# Patient Record
Sex: Female | Born: 1985 | Race: Black or African American | Hispanic: No | Smoking: Former smoker
Health system: Southern US, Community
[De-identification: ages and names within clinical notes are randomized; demographics above are authoritative.]

## PROBLEM LIST (undated history)

## (undated) ENCOUNTER — Inpatient Hospital Stay (HOSPITAL_COMMUNITY): Payer: Self-pay

## (undated) DIAGNOSIS — N159 Renal tubulo-interstitial disease, unspecified: Secondary | ICD-10-CM

## (undated) DIAGNOSIS — B20 Human immunodeficiency virus [HIV] disease: Secondary | ICD-10-CM

## (undated) DIAGNOSIS — Z8759 Personal history of other complications of pregnancy, childbirth and the puerperium: Secondary | ICD-10-CM

## (undated) DIAGNOSIS — D649 Anemia, unspecified: Secondary | ICD-10-CM

---

## 2009-06-11 ENCOUNTER — Emergency Department (HOSPITAL_COMMUNITY): Admission: EM | Admit: 2009-06-11 | Discharge: 2009-06-11 | Payer: Self-pay | Admitting: Emergency Medicine

## 2009-06-16 ENCOUNTER — Emergency Department (HOSPITAL_COMMUNITY): Admission: EM | Admit: 2009-06-16 | Discharge: 2009-06-16 | Payer: Self-pay | Admitting: Emergency Medicine

## 2010-02-05 ENCOUNTER — Emergency Department (HOSPITAL_COMMUNITY): Admission: EM | Admit: 2010-02-05 | Discharge: 2010-02-06 | Payer: Self-pay | Admitting: Emergency Medicine

## 2010-02-27 ENCOUNTER — Inpatient Hospital Stay (HOSPITAL_COMMUNITY): Admission: AD | Admit: 2010-02-27 | Discharge: 2010-02-28 | Payer: Self-pay | Admitting: Obstetrics & Gynecology

## 2010-03-29 ENCOUNTER — Encounter: Payer: Self-pay | Admitting: Internal Medicine

## 2010-03-29 DIAGNOSIS — B20 Human immunodeficiency virus [HIV] disease: Secondary | ICD-10-CM

## 2010-03-29 LAB — CONVERTED CEMR LAB
Hemoglobin: 10.5 g/dL
Platelets: 266 10*3/uL

## 2010-03-30 ENCOUNTER — Ambulatory Visit: Payer: Self-pay | Admitting: Internal Medicine

## 2010-03-30 DIAGNOSIS — O09299 Supervision of pregnancy with other poor reproductive or obstetric history, unspecified trimester: Secondary | ICD-10-CM | POA: Insufficient documentation

## 2010-03-30 LAB — CONVERTED CEMR LAB
ALT: 12 units/L (ref 0–35)
AST: 21 units/L (ref 0–37)
Albumin: 3.5 g/dL (ref 3.5–5.2)
BUN: 6 mg/dL (ref 6–23)
Cholesterol: 228 mg/dL — ABNORMAL HIGH (ref 0–200)
Creatinine, Ser: 0.47 mg/dL (ref 0.40–1.20)
Eosinophils Relative: 0 % (ref 0–5)
Glucose, Bld: 99 mg/dL (ref 70–99)
HIV-1 RNA Quant, Log: 3.85 — ABNORMAL HIGH (ref <1.68)
HIV-1 antibody: POSITIVE — AB
HIV: REACTIVE
Hemoglobin: 10.4 g/dL — ABNORMAL LOW (ref 12.0–15.0)
LDL Cholesterol: 140 mg/dL — ABNORMAL HIGH (ref 0–99)
Lymphocytes Relative: 25 % (ref 12–46)
Lymphs Abs: 1.4 10*3/uL (ref 0.7–4.0)
MCHC: 31.8 g/dL (ref 30.0–36.0)
MCV: 72.2 fL — ABNORMAL LOW (ref 78.0–100.0)
Monocytes Absolute: 0.5 10*3/uL (ref 0.1–1.0)
Neutrophils Relative %: 67 % (ref 43–77)
Pap Smear: NORMAL
Platelets: 306 10*3/uL (ref 150–400)
RBC: 4.53 M/uL (ref 3.87–5.11)
RDW: 15.3 % (ref 11.5–15.5)
Total Protein: 7.2 g/dL (ref 6.0–8.3)
Triglycerides: 177 mg/dL — ABNORMAL HIGH (ref <150)

## 2010-04-02 LAB — CONVERTED CEMR LAB
Bilirubin Urine: NEGATIVE
Crystals: NONE SEEN
GC Probe Amp, Urine: NEGATIVE
Hemoglobin, Urine: NEGATIVE
Protein, ur: NEGATIVE mg/dL
Specific Gravity, Urine: 1.022 (ref 1.005–1.030)

## 2010-04-05 ENCOUNTER — Encounter: Payer: Self-pay | Admitting: Obstetrics & Gynecology

## 2010-04-05 ENCOUNTER — Ambulatory Visit: Payer: Self-pay | Admitting: Family Medicine

## 2010-04-11 ENCOUNTER — Ambulatory Visit: Payer: Self-pay | Admitting: Internal Medicine

## 2010-04-12 DIAGNOSIS — Z8619 Personal history of other infectious and parasitic diseases: Secondary | ICD-10-CM

## 2010-04-19 ENCOUNTER — Ambulatory Visit: Payer: Self-pay | Admitting: Obstetrics & Gynecology

## 2010-04-19 LAB — CONVERTED CEMR LAB
Chlamydia, DNA Probe: NEGATIVE
GC Probe Amp, Genital: NEGATIVE

## 2010-04-20 ENCOUNTER — Encounter: Payer: Self-pay | Admitting: Obstetrics & Gynecology

## 2010-04-20 LAB — CONVERTED CEMR LAB
Clue Cells Wet Prep HPF POC: NONE SEEN
Yeast Wet Prep HPF POC: NONE SEEN

## 2010-05-03 ENCOUNTER — Ambulatory Visit: Payer: Self-pay | Admitting: Family Medicine

## 2010-05-03 ENCOUNTER — Encounter (INDEPENDENT_AMBULATORY_CARE_PROVIDER_SITE_OTHER): Payer: Self-pay | Admitting: *Deleted

## 2010-05-08 ENCOUNTER — Ambulatory Visit (HOSPITAL_COMMUNITY): Admission: RE | Admit: 2010-05-08 | Discharge: 2010-05-08 | Payer: Self-pay | Admitting: Family Medicine

## 2010-05-10 ENCOUNTER — Encounter: Payer: Self-pay | Admitting: Internal Medicine

## 2010-05-10 ENCOUNTER — Ambulatory Visit: Payer: Self-pay | Admitting: Family Medicine

## 2010-05-10 ENCOUNTER — Encounter (INDEPENDENT_AMBULATORY_CARE_PROVIDER_SITE_OTHER): Payer: Self-pay | Admitting: *Deleted

## 2010-05-11 ENCOUNTER — Encounter: Payer: Self-pay | Admitting: Family Medicine

## 2010-05-11 ENCOUNTER — Ambulatory Visit: Payer: Self-pay | Admitting: Internal Medicine

## 2010-05-11 ENCOUNTER — Inpatient Hospital Stay (HOSPITAL_COMMUNITY): Admission: AD | Admit: 2010-05-11 | Discharge: 2010-05-13 | Payer: Self-pay | Admitting: Obstetrics and Gynecology

## 2010-05-11 LAB — CONVERTED CEMR LAB
ALT: 10 units/L (ref 0–35)
Albumin: 3.4 g/dL — ABNORMAL LOW (ref 3.5–5.2)
BUN: 5 mg/dL — ABNORMAL LOW (ref 6–23)
Basophils Relative: 0 % (ref 0–1)
CO2: 20 meq/L (ref 19–32)
Chloride: 102 meq/L (ref 96–112)
Creatinine, Ser: 0.52 mg/dL (ref 0.40–1.20)
Eosinophils Absolute: 0.1 10*3/uL (ref 0.0–0.7)
Monocytes Relative: 9 % (ref 3–12)
Neutrophils Relative %: 61 % (ref 43–77)
Potassium: 3.5 meq/L (ref 3.5–5.3)
RDW: 17.9 % — ABNORMAL HIGH (ref 11.5–15.5)
Sodium: 134 meq/L — ABNORMAL LOW (ref 135–145)
Total Bilirubin: 0.9 mg/dL (ref 0.3–1.2)

## 2010-05-17 ENCOUNTER — Ambulatory Visit: Payer: Self-pay | Admitting: Obstetrics & Gynecology

## 2010-05-21 ENCOUNTER — Ambulatory Visit: Payer: Self-pay | Admitting: Obstetrics & Gynecology

## 2010-05-24 ENCOUNTER — Ambulatory Visit: Payer: Self-pay | Admitting: Obstetrics and Gynecology

## 2010-05-25 ENCOUNTER — Ambulatory Visit: Payer: Self-pay | Admitting: Internal Medicine

## 2010-05-28 ENCOUNTER — Ambulatory Visit: Payer: Self-pay | Admitting: Obstetrics & Gynecology

## 2010-05-31 ENCOUNTER — Encounter: Payer: Self-pay | Admitting: Obstetrics & Gynecology

## 2010-05-31 ENCOUNTER — Ambulatory Visit: Payer: Self-pay | Admitting: Obstetrics & Gynecology

## 2010-05-31 LAB — CONVERTED CEMR LAB: GC Probe Amp, Genital: NEGATIVE

## 2010-06-01 ENCOUNTER — Encounter: Payer: Self-pay | Admitting: Obstetrics & Gynecology

## 2010-06-01 LAB — CONVERTED CEMR LAB: Trich, Wet Prep: NONE SEEN

## 2010-06-04 ENCOUNTER — Ambulatory Visit: Payer: Self-pay | Admitting: Obstetrics & Gynecology

## 2010-06-07 ENCOUNTER — Ambulatory Visit: Payer: Self-pay | Admitting: Obstetrics & Gynecology

## 2010-06-11 ENCOUNTER — Ambulatory Visit: Payer: Self-pay | Admitting: Obstetrics & Gynecology

## 2010-06-13 ENCOUNTER — Inpatient Hospital Stay (HOSPITAL_COMMUNITY): Admission: RE | Admit: 2010-06-13 | Discharge: 2010-06-16 | Payer: Self-pay | Admitting: Obstetrics & Gynecology

## 2010-06-13 ENCOUNTER — Ambulatory Visit: Payer: Self-pay | Admitting: Family Medicine

## 2010-07-30 ENCOUNTER — Ambulatory Visit: Payer: Self-pay | Admitting: Family Medicine

## 2010-08-02 ENCOUNTER — Ambulatory Visit: Payer: Self-pay | Admitting: Internal Medicine

## 2010-09-27 ENCOUNTER — Encounter (INDEPENDENT_AMBULATORY_CARE_PROVIDER_SITE_OTHER): Payer: Self-pay | Admitting: *Deleted

## 2010-10-01 ENCOUNTER — Ambulatory Visit: Payer: Self-pay | Admitting: Internal Medicine

## 2010-11-13 ENCOUNTER — Encounter (INDEPENDENT_AMBULATORY_CARE_PROVIDER_SITE_OTHER): Payer: Self-pay | Admitting: *Deleted

## 2010-11-20 NOTE — Miscellaneous (Signed)
Summary: HIPAA Restrictions  HIPAA Restrictions   Imported By: Florinda Marker 03/30/2010 14:03:39  _____________________________________________________________________  External Attachment:    Type:   Image     Comment:   External Document

## 2010-11-20 NOTE — Consult Note (Signed)
Summary: Prenatal Flowsheet  Prenatal Flowsheet   Imported By: Florinda Marker 04/18/2010 15:19:49  _____________________________________________________________________  External Attachment:    Type:   Image     Comment:   External Document

## 2010-11-20 NOTE — Assessment & Plan Note (Signed)
Summary: F/U /OV/VS   CC:  f/u.  History of Present Illness: Pt here for f/u.  She is about [redacted] weeks pregnant. She states that she has been feeling well. She states that she has been taking her HIV meds every day.   Updated Prior Medication List: PRENAVITE MULTIPLE VITAMIN 28-0.8 MG TABS (PRENATAL VIT-FE FUMARATE-FA) Take 1 tablet by mouth once a day COMBIVIR 150-300 MG TABS (LAMIVUDINE-ZIDOVUDINE) Take 1 tablet by mouth two times a day KALETRA 200-50 MG TABS (LOPINAVIR-RITONAVIR) take 3 tablets by mouth two times a day  Current Allergies (reviewed today): ! BACTRIM DS (SULFAMETHOXAZOLE-TRIMETHOPRIM) Review of Systems  The patient denies fever, chest pain, and syncope.    Vital Signs:  Patient profile:   25 year old female Height:      64 inches (162.56 cm) Weight:      177 pounds (80.45 kg) BMI:     30.49 Temp:     98.1 degrees F (36.72 degrees C) oral BP sitting:   107 / 72  (left arm)  Vitals Entered By: Wendall Mola CMA Duncan Dull) (May 25, 2010 10:17 AM) CC: f/u Is Patient Diabetic? No Pain Assessment Patient in pain? no      Nutritional Status BMI of > 30 = obese Nutritional Status Detail nl  Does patient need assistance? Functional Status Self care Ambulation Normal   Physical Exam  General:  alert, well-developed, well-nourished, and well-hydrated.   Head:  normocephalic and atraumatic.   Lungs:  normal breath sounds.     Impression & Recommendations:  Problem # 1:  HIV INFECTION (ICD-042) Pt is about [redacted] weeks pregnant and the determination needs to be made regarding type of delivery.  Her las VL was 816.  I am going to run a stat VL today and should have it back early next week.  I will send the results to [facility].  Hopefully her VL will have come down so she can have a vaginal delivery. Once the baby is delivered she should reduce her Kaletra to 2 tablets two times a day and call for an appt. Orders: Est. Patient Level IV (30865) T-HIV  Viral Load [phone])  Diagnostics Reviewed:  HIV: HIV positive - not AIDS (05/10/2010)   HIV-Western blot: Positive (03/30/2010)   CD4: 620 (05/11/2010)   WBC: 5.6 (05/11/2010)   Hgb: 10.3 (05/11/2010)   HCT: 31.0 (05/11/2010)   Platelets: 242 (05/11/2010) HIV genotype: See Comment (03/30/2010)   HIV-1 RNA: 816 (05/11/2010)   HBSAg: NEG (03/30/2010)  Patient Instructions: 1)  will call patient with results of viral load 2)  after baby is delivered can decrease Kaletra to 2 tablets twice a day and then call to schedule an appt.

## 2010-11-20 NOTE — Miscellaneous (Signed)
Summary: RW Flowsheet update  Clinical Lists Changes  Observations: Added new observation of PAYOR: Medicaid (05/10/2010 16:41) Added new observation of REC_MESSAGE: Yes (05/10/2010 16:41) Added new observation of RECPHONECALL: Yes (05/10/2010 16:41) Added new observation of REC_MAIL: Yes (05/10/2010 16:41) Added new observation of MARITAL STAT: SEPARTATED (05/10/2010 16:41) Added new observation of LATINO/HISP: No (05/10/2010 16:41) Added new observation of INFECTDIS MD: Philipp Deputy (05/10/2010 16:41) Added new observation of DATE1STVISIT: 04/11/2010 (05/10/2010 16:41) Added new observation of RACE: African American (05/10/2010 16:41) Added new observation of PREVENTPOS: 04/11/2010 (05/10/2010 16:41) Added new observation of MEDADHERENCE: 04/11/2010 (05/10/2010 16:41) Added new observation of HIV STATUS: HIV positive - not AIDS (05/10/2010 16:41) Added new observation of HIVCURRMEDST: 04/11/2010 (05/10/2010 16:41) Added new observation of HIVINITMEDST: 04/11/2010 (05/10/2010 16:41) Added new observation of GENDER: Female (05/10/2010 16:41) Added new observation of HIV RISK BEH: Heterosexual contact (05/10/2010 16:41) Added new observation of PATNTCOUNTY: [place] (05/10/2010 16:41) Added new observation of RW VITAL STA: Active (05/10/2010 16:41) Added new observation of RWPARTICIP: Yes (05/10/2010 16:41) Added new observation of HIVMEDPREG: Yes (05/10/2010 16:41)           Medication Adherence: 04/11/2010   Adherence to medications reviewed with patient. Counseling to provide adequate adherence provided    Prevention For Positives: 04/11/2010   Safe sex practices discussed with patient. Condoms offered.

## 2010-11-20 NOTE — Miscellaneous (Signed)
Summary: Orders Update  Clinical Lists Changes  Problems: Added new problem of SUPERVISION OF OTHER HIGH-RISK PREGNANCY (ICD-V23.89) Added new problem of HIV INFECTION (ICD-042) Orders: Added new Test order of T-Comprehensive Metabolic Panel [phone]) - Signed Added new Test order of T-CBC w/Diff [phone]) - Signed Added new Test order of T-CD4SP [facility]) (CD4SP) - Signed Added new Test order of T-Chlamydia  Probe, urine [phone]) - Signed Added new Test order of T-GC Probe, urine [phone]) - Signed Added new Test order of T-Hepatitis B Surface Antigen [phone]) - Signed Added new Test order of T-Hepatitis B Surface Antibody [phone]) - Signed Added new Test order of T-Hepatitis B Core Antibody ([id number]) - Signed Added new Test order of T-Hepatitis C Antibody ([id number]) - Signed Added new Test order of T-HIV Antibody  (Reflex) [phone]) - Signed Added new Test order of T-HIV Ab Confirmatory Test/Western Blot ([id number]) - Signed Added new Test order of T-Lipid Profile ([id number]) - Signed Added new Test order of T-RPR (Syphilis) ([id number]) - Signed Added new Test order of T-Urinalysis ([id number]) - Signed Added new Test order of T-HIV1 Quant rflx Ultra or Genotype ([id number]) - Signed Added new Test order of T-Hepatitis A Antibody ([id number]) - Signed

## 2010-11-20 NOTE — Assessment & Plan Note (Signed)
Summary: new 042 prego 26   CC:  new patient to establish and lab results.  History of Present Illness: This is the first ID clinic visit for Catherine Martinez. She is [redacted] weeks pregnant and recently found out she was HIV (+).  She tested negative about 3 years ago.  her pregnancy has been going well.  Pending results of lab work on HIV meds the decsision will be made to either induce her at 38 weeks and perform a c-section or have her deliver vaginally. Diagnosed with chlamydia and treated by her OB. Pt has a history of stillbirth in 2003 and has 2 live children. Pt's risk factor is heterosexual intercourse.  Preventive Screening-Counseling & Management  Alcohol-Tobacco     Alcohol drinks/day: 0     Year Quit: 3-11     Pack years: 1pack every 2 weeks   Caffeine-Diet-Exercise     Caffeine use/day: 0     Does Patient Exercise: yes     Type of exercise: swimming and walking     Exercise (avg: min/session): 30-60     Times/week: 5  Safety-Violence-Falls     Seat Belt Use: yes      Sexual History:  dating father of baby.        Drug Use:  never.    Comments: pt. declined condoms   Updated Prior Medication List: PRENAVITE MULTIPLE VITAMIN 28-0.8 MG TABS (PRENATAL VIT-FE FUMARATE-FA) Take 1 tablet by mouth once a day COMBIVIR 150-300 MG TABS (LAMIVUDINE-ZIDOVUDINE) Take 1 tablet by mouth two times a day KALETRA 200-50 MG TABS (LOPINAVIR-RITONAVIR) take 3 tablets by mouth two times a day  Current Allergies: ! BACTRIM DS (SULFAMETHOXAZOLE-TRIMETHOPRIM) Social History: Sexual History:  dating father of baby Drug Use:  never  Review of Systems  The patient denies anorexia, fever, and abdominal pain.    Vital Signs:  Patient profile:   25 year old female Height:      64 inches (162.56 cm) Weight:      173.4 pounds (78.82 kg) BMI:     29.87 Temp:     98.5 degrees F (36.94 degrees C) oral Pulse rate:   97 / minute BP sitting:   105 / 73  (left arm)  Vitals Entered By: Wendall Mola CMA Duncan Dull) (April 11, 2010 10:48 AM) CC: new patient to establish and lab results Is Patient Diabetic? No Pain Assessment Patient in pain? no      Nutritional Status BMI of 25 - 29 = overweight Nutritional Status Detail appetite "better"  Have you ever been in a relationship where you felt threatened, hurt or afraid?No   Does patient need assistance? Functional Status Self care Ambulation Normal Comments pt. is [redacted] weeks pregnant   Physical Exam  General:  alert, well-developed, well-nourished, and well-hydrated.   Head:  normocephalic and atraumatic.   Mouth:  pharynx pink and moist.  no thrush  Lungs:  normal breath sounds.   Abdomen:  prgnant abdomen   Impression & Recommendations:  Problem # 1:  HIV INFECTION (ICD-042) I discussed the pathophysiology of HIV and the meaning of CD4ct and VL. Discussed need to be on antiretroviral therapy to prevent infection of her baby.  She has a low VL so we may need to get her VL <48 before she is due to deliver so they can deliver her vaginally. We will start her on combivir and Kaletra and re-check her VL in 4 weeks. Potential side effects are discussed.  If we are unable to  get her VL down she will need an elective c-section.  Orders: New Patient Level III (99203)Future Orders: T-CBC w/Diff ([id number]) ... 05/14/2010 T-CD4SP ([facility]) (CD4SP) ... 05/14/2010 T-Comprehensive Metabolic Panel [phone]) ... 05/14/2010 T-HIV Viral Load [phone]) ... 05/14/2010  Medications Added to Medication List This Visit: 1)  Prenavite Multiple Vitamin 28-0.8 Mg Tabs (Prenatal vit-fe fumarate-fa) .... Take 1 tablet by mouth once a day 2)  Combivir 150-300 Mg Tabs (Lamivudine-zidovudine) .... Take 1 tablet by mouth two times a day 3)  Kaletra 200-50 Mg Tabs (Lopinavir-ritonavir) .... Take 3 tablets by mouth two times a day  Patient Instructions: 1)  Please schedule a follow-up appointment in 6 weeks. Prescriptions: KALETRA  200-50 MG TABS (LOPINAVIR-RITONAVIR) take 3 tablets by mouth two times a day  #180 x 5   Entered and Authorized by:   Yisroel Ramming MD   Signed by:   Yisroel Ramming MD on 04/11/2010   Method used:   Print then Give to Patient   RxID:   [id number] COMBIVIR 150-300 MG TABS (LAMIVUDINE-ZIDOVUDINE) Take 1 tablet by mouth two times a day  #60 x 5   Entered and Authorized by:   Yisroel Ramming MD   Signed by:   Yisroel Ramming MD on 04/11/2010   Method used:   Print then Give to Patient   RxID:   [id number]

## 2010-11-20 NOTE — Assessment & Plan Note (Signed)
Summary: F/U OV/VS   CC:  follow-up visit, lab results, and and discuss meds.  History of Present Illness: Catherine Martinez delivered her son without incidence. He has tested negative and is about 2 months old.  She is doing well and would like to change her medication to a easier regimen.  Preventive Screening-Counseling & Management  Alcohol-Tobacco     Alcohol drinks/day: 0     Year Quit: 3-11     Pack years: 1pack every 2 weeks   Caffeine-Diet-Exercise     Caffeine use/day: 0     Does Patient Exercise: yes     Type of exercise: swimming and walking     Exercise (avg: min/session): 30-60     Times/week: 5  Safety-Violence-Falls     Seat Belt Use: yes      Sexual History:  dating father of baby.        Drug Use:  never.    Comments: Catherine Martinez. declined condoms   Updated Prior Medication List: PRENAVITE MULTIPLE VITAMIN 28-0.8 MG TABS (PRENATAL VIT-FE FUMARATE-FA) Take 1 tablet by mouth once a day ATRIPLA 600-200-300 MG TABS (EFAVIRENZ-EMTRICITAB-TENOFOVIR) Take 1 tablet by mouth at bedtime  Current Allergies (reviewed today): ! BACTRIM DS (SULFAMETHOXAZOLE-TRIMETHOPRIM)  Review of Systems  The patient denies anorexia, fever, and weight loss.    Vital Signs:  Patient profile:   25 year old female Height:      64 inches (162.56 cm) Weight:      157.4 pounds (71.55 kg) BMI:     27.12 Temp:     98.7 degrees F (37.06 degrees C) oral Pulse rate:   80 / minute BP sitting:   124 / 84  (right arm)  Vitals Entered By: Wendall Mola CMA Duncan Dull) (August 02, 2010 9:27 AM) CC: follow-up visit, lab results, and discuss meds Is Patient Diabetic? No Pain Assessment Patient in pain? no      Nutritional Status BMI of 25 - 29 = overweight Nutritional Status Detail appetite "good"  Does patient need assistance? Functional Status Self care Ambulation Normal Comments Catherine Martinez. missed one dose of meds since last visit   Physical Exam  General:  alert, well-developed, well-nourished,  and well-hydrated.   Head:  normocephalic and atraumatic.   Mouth:  pharynx pink and moist.   Lungs:  normal breath sounds.     Impression & Recommendations:  Problem # 1:  HIV INFECTION (ICD-042)  Will change regimen to Atripla. Discused potential side effects. Catherine Martinez will f/u in 6 weeks for repeat labs.  Influenza a pneumavax given. Diagnostics Reviewed:  HIV: HIV positive - not AIDS (05/10/2010)   HIV-Western blot: Positive (03/30/2010)   CD4: 620 (05/11/2010)   WBC: 5.6 (05/11/2010)   Hgb: 10.3 (05/11/2010)   HCT: 31.0 (05/11/2010)   Platelets: 242 (05/11/2010) HIV genotype: See Comment (03/30/2010)   HIV-1 RNA: 631 (05/25/2010)   HBSAg: NEG (03/30/2010)  Orders: Est. Patient Level III (99213)Future Orders: T-CD4SP ([facility]) (CD4SP) ... 09/13/2010 T-HIV Viral Load [phone]) ... 09/13/2010 T-Comprehensive Metabolic Panel [phone]) ... 09/13/2010 T-CBC w/Diff ([id number]) ... 09/13/2010  Medications Added to Medication List This Visit: 1)  Atripla 600-200-300 Mg Tabs (Efavirenz-emtricitab-tenofovir) .... Take 1 tablet by mouth at bedtime  Other Orders: Influenza Vaccine NON MCR (57846) TB Skin Test (96295) Admin 1st Vaccine (28413) Pneumococcal Vaccine (24401) Admin of Any Addtl Vaccine (02725)  Patient Instructions: 1)  Please schedule a follow-up appointment in 8 weeks, 2 weeks after labs. Prescriptions: ATRIPLA 600-200-300 MG TABS (EFAVIRENZ-EMTRICITAB-TENOFOVIR) Take 1 tablet by  mouth at bedtime  #30 x 5   Entered by:   Wendall Mola CMA ( AAMA)   Authorized by:   Yisroel Ramming MD   Signed by:   Wendall Mola CMA ( AAMA) on 08/02/2010   Method used:   Electronically to        [facility] (retail)       [address]       [place], [place]  [address]       Ph: [phone]       Fax: [phone]   RxID:   [id number] ATRIPLA 600-200-300 MG TABS (EFAVIRENZ-EMTRICITAB-TENOFOVIR) Take 1 tablet by mouth at bedtime   #30 x 5   Entered and Authorized by:   Yisroel Ramming MD   Signed by:   Yisroel Ramming MD on 08/02/2010   Method used:   Print then Give to Patient   RxID:   [id number]   Per Catherine Martinez. RX sent electronically to [facility] on [address] CMA Duncan Dull)  August 02, 2010 9:47 AM    Immunizations Administered:  Influenza Vaccine # 1:    Vaccine Type: Fluvax Non-MCR    Site: right deltoid    Mfr: Novartis    Dose: 0.5 ml    Route: IM    Given by: Wendall Mola CMA ( AAMA)    Exp. Date: 01/20/2011    Lot #: [id number]    VIS given: 05/15/10 version given August 02, 2010.  PPD Skin Test:    Vaccine Type: PPD    Site: left forearm    Mfr: Sanofi Pasteur    Dose: 0.1 ml    Route: ID    Given by: Wendall Mola CMA ( AAMA)    Exp. Date: 08/23/2011    Lot #: [id number]  Pneumonia Vaccine:    Vaccine Type: Pneumovax    Site: left deltoid    Mfr: Merck    Dose: 0.5 ml    Route: IM    Given by: Wendall Mola CMA ( AAMA)    Exp. Date: 01/05/2012    Lot #: [id number]    VIS given: 09/25/09 version given August 02, 2010.  Flu Vaccine Consent Questions:    Do you have a history of severe allergic reactions to this vaccine? no    Any prior history of allergic reactions to egg and/or gelatin? no    Do you have a sensitivity to the preservative Thimersol? no    Do you have a past history of Guillan-Barre Syndrome? no    Do you currently have an acute febrile illness? no    Have you ever had a severe reaction to latex? no    Vaccine information given and explained to patient? yes    Are you currently pregnant? no

## 2010-11-20 NOTE — Assessment & Plan Note (Signed)
Summary: New 042  26 wks preg/update info.      Infectious Disease New Patient Intake [facility]   Return Appointment With Physician: Dr Philipp Deputy    Physician Name: Dr Ambrose Mantle   Medical History  Family History Hypertension: Yes  Family Side: Maternal  Comments: Grandfather Diabetes: Yes  Family Side: Maternal Thyroid Disease: Yes  Family Side: Maternal  Comments: Aunt  Tobacco Use: previous Year quit smoking: 3-11 Smoking pack-years: 1pack every 2 weeks   HIV Intake Information When did you first test positive for HIV? 03/29/2010  Obstetric/Gynecological History Pap Smear Date: 2008 Result:  normal Office/Clinic:  Dr Ambrose Mantle    Last Menstrual Period: 01-03-10  Pregnancy History Gravida: 4 Para: 3    -  Date:  03/29/2010    Hemoglobin: 10.5    WBC: 6.9    Platelets: 266

## 2010-11-20 NOTE — Miscellaneous (Signed)
Summary: RW Flowsheet updated  Clinical Lists Changes  Observations: Added new observation of #CHILDDELHIV: 0  (09/27/2010 13:08) Added new observation of #CHILDDEL: 1  (09/27/2010 13:08) Added new observation of DELIV TYPE: Vaginal  (09/27/2010 13:08)

## 2010-11-22 NOTE — Miscellaneous (Signed)
  Clinical Lists Changes 

## 2010-11-28 ENCOUNTER — Encounter (INDEPENDENT_AMBULATORY_CARE_PROVIDER_SITE_OTHER): Payer: Self-pay | Admitting: *Deleted

## 2010-12-06 NOTE — Miscellaneous (Signed)
  Clinical Lists Changes 

## 2011-01-04 LAB — POCT URINALYSIS DIPSTICK
Glucose, UA: NEGATIVE mg/dL
Hgb urine dipstick: NEGATIVE
Ketones, ur: NEGATIVE mg/dL
Nitrite: POSITIVE — AB
Protein, ur: 30 mg/dL — AB
Specific Gravity, Urine: 1.025 (ref 1.005–1.030)
Specific Gravity, Urine: >=1.03 (ref 1.005–1.030)
Urobilinogen, UA: 1 mg/dL (ref 0.0–1.0)
pH: 6 (ref 5.0–8.0)
pH: 6 (ref 5.0–8.0)
pH: 7 (ref 5.0–8.0)

## 2011-01-04 LAB — CBC
MCH: 26 pg (ref 26.0–34.0)
MCHC: 32.4 g/dL (ref 30.0–36.0)
RBC: 4.02 MIL/uL (ref 3.87–5.11)
RDW: 19.9 % — ABNORMAL HIGH (ref 11.5–15.5)
WBC: 4 10*3/uL (ref 4.0–10.5)

## 2011-01-04 LAB — RPR: RPR Ser Ql: NONREACTIVE

## 2011-01-05 LAB — CBC
MCHC: 32.5 g/dL (ref 30.0–36.0)
Platelets: 211 10*3/uL (ref 150–400)
RDW: 17.5 % — ABNORMAL HIGH (ref 11.5–15.5)

## 2011-01-05 LAB — URINALYSIS, ROUTINE W REFLEX MICROSCOPIC
Ketones, ur: 15 mg/dL — AB
Specific Gravity, Urine: >1.03 — ABNORMAL HIGH (ref 1.005–1.030)

## 2011-01-05 LAB — POCT URINALYSIS DIP (DEVICE)
Glucose, UA: 100 mg/dL — AB
Glucose, UA: NEGATIVE mg/dL
Ketones, ur: 40 mg/dL — AB
Specific Gravity, Urine: 1.025 (ref 1.005–1.030)
Urobilinogen, UA: 1 mg/dL (ref 0.0–1.0)
pH: 6.5 (ref 5.0–8.0)

## 2011-01-05 LAB — URINE MICROSCOPIC-ADD ON

## 2011-01-05 LAB — URINE CULTURE: Colony Count: NO GROWTH

## 2011-01-05 LAB — KLEIHAUER-BETKE STAIN: Quantitation Fetal Hemoglobin: 25 mL

## 2011-01-06 LAB — POCT URINALYSIS DIP (DEVICE)
Bilirubin Urine: NEGATIVE
Glucose, UA: NEGATIVE mg/dL
Glucose, UA: NEGATIVE mg/dL
Ketones, ur: 40 mg/dL — AB
Ketones, ur: NEGATIVE mg/dL
Ketones, ur: NEGATIVE mg/dL
Nitrite: POSITIVE — AB
Protein, ur: 30 mg/dL — AB
Specific Gravity, Urine: 1.025 (ref 1.005–1.030)
Specific Gravity, Urine: 1.025 (ref 1.005–1.030)
Urobilinogen, UA: 1 mg/dL (ref 0.0–1.0)
Urobilinogen, UA: 0.2 mg/dL (ref 0.0–1.0)

## 2011-01-07 LAB — T-HELPER CELL (CD4) - (RCID CLINIC ONLY)
CD4 % Helper T Cell: 30 % — ABNORMAL LOW (ref 33–55)
CD4 T Cell Abs: 410 uL (ref 400–2700)

## 2011-01-08 LAB — COMPREHENSIVE METABOLIC PANEL
ALT: 30 U/L (ref 0–35)
AST: 50 U/L — ABNORMAL HIGH (ref 0–37)
Albumin: 2.8 g/dL — ABNORMAL LOW (ref 3.5–5.2)
Alkaline Phosphatase: 51 U/L (ref 39–117)
BUN: 5 mg/dL — ABNORMAL LOW (ref 6–23)
Chloride: 106 mEq/L (ref 96–112)
GFR calc non Af Amer: >60 mL/min (ref >60)
Glucose, Bld: 77 mg/dL (ref 70–99)
Potassium: 3.2 mEq/L — ABNORMAL LOW (ref 3.5–5.1)
Sodium: 135 mEq/L (ref 135–145)
Total Protein: 6.3 g/dL (ref 6.0–8.3)

## 2011-01-08 LAB — CBC
Hemoglobin: 10.6 g/dL — ABNORMAL LOW (ref 12.0–15.0)
RDW: 14.7 % (ref 11.5–15.5)

## 2011-01-08 LAB — URINE CULTURE

## 2011-01-08 LAB — RAPID URINE DRUG SCREEN, HOSP PERFORMED
Amphetamines: NOT DETECTED
Barbiturates: NOT DETECTED
Benzodiazepines: NOT DETECTED

## 2011-01-08 LAB — URINE MICROSCOPIC-ADD ON

## 2011-01-08 LAB — URINALYSIS, ROUTINE W REFLEX MICROSCOPIC
Bilirubin Urine: NEGATIVE
Glucose, UA: NEGATIVE mg/dL
Hgb urine dipstick: NEGATIVE
Ketones, ur: 40 mg/dL — AB
Protein, ur: 30 mg/dL — AB

## 2011-01-26 LAB — COMPREHENSIVE METABOLIC PANEL WITH GFR
ALT: 20 U/L (ref 0–35)
AST: 37 U/L (ref 0–37)
Albumin: 3.6 g/dL (ref 3.5–5.2)
Alkaline Phosphatase: 51 U/L (ref 39–117)
BUN: 10 mg/dL (ref 6–23)
CO2: 25 meq/L (ref 19–32)
Calcium: 8.5 mg/dL (ref 8.4–10.5)
Chloride: 103 meq/L (ref 96–112)
Creatinine, Ser: 0.83 mg/dL (ref 0.4–1.2)
GFR calc Af Amer: >60 mL/min (ref >60)
GFR calc non Af Amer: >60 mL/min (ref >60)
Glucose, Bld: 99 mg/dL (ref 70–99)
Potassium: 3.2 meq/L — ABNORMAL LOW (ref 3.5–5.1)
Sodium: 139 meq/L (ref 135–145)
Total Bilirubin: 0.9 mg/dL (ref 0.3–1.2)
Total Protein: 7.9 g/dL (ref 6.0–8.3)

## 2011-01-26 LAB — HEMOCCULT GUIAC POC 1CARD (OFFICE): Fecal Occult Bld: POSITIVE

## 2011-01-26 LAB — CBC
HCT: 42.3 % (ref 36.0–46.0)
Hemoglobin: 13.8 g/dL (ref 12.0–15.0)
MCHC: 32.7 g/dL (ref 30.0–36.0)
MCV: 72.8 fL — ABNORMAL LOW (ref 78.0–100.0)
Platelets: 133 K/uL — ABNORMAL LOW (ref 150–400)
RBC: 5.82 MIL/uL — ABNORMAL HIGH (ref 3.87–5.11)
RDW: 14 % (ref 11.5–15.5)
WBC: 5.4 K/uL (ref 4.0–10.5)

## 2011-01-26 LAB — DIFFERENTIAL
Basophils Absolute: 0 10*3/uL (ref 0.0–0.1)
Basophils Relative: 0 % (ref 0–1)
Monocytes Relative: 11 % (ref 3–12)
Neutro Abs: 3.6 10*3/uL (ref 1.7–7.7)
Neutrophils Relative %: 65 % (ref 43–77)

## 2011-01-26 LAB — URINALYSIS, ROUTINE W REFLEX MICROSCOPIC
Ketones, ur: >80 mg/dL — AB
Nitrite: NEGATIVE
Protein, ur: 100 mg/dL — AB
Urobilinogen, UA: 1 mg/dL (ref 0.0–1.0)

## 2011-01-26 LAB — PREGNANCY, URINE: Preg Test, Ur: NEGATIVE

## 2011-01-26 LAB — LIPASE, BLOOD: Lipase: 79 U/L — ABNORMAL HIGH (ref 11–59)

## 2012-03-01 ENCOUNTER — Encounter (HOSPITAL_COMMUNITY): Payer: Self-pay | Admitting: Emergency Medicine

## 2012-03-01 ENCOUNTER — Emergency Department (HOSPITAL_COMMUNITY)
Admission: EM | Admit: 2012-03-01 | Discharge: 2012-03-02 | Disposition: A | Discharge status: Home / Self Care | Payer: Self-pay | Attending: Emergency Medicine | Admitting: Emergency Medicine

## 2012-03-01 DIAGNOSIS — F17200 Nicotine dependence, unspecified, uncomplicated: Secondary | ICD-10-CM | POA: Insufficient documentation

## 2012-03-01 DIAGNOSIS — R109 Unspecified abdominal pain: Secondary | ICD-10-CM | POA: Insufficient documentation

## 2012-03-01 DIAGNOSIS — N10 Acute tubulo-interstitial nephritis: Secondary | ICD-10-CM | POA: Insufficient documentation

## 2012-03-01 DIAGNOSIS — R112 Nausea with vomiting, unspecified: Secondary | ICD-10-CM | POA: Insufficient documentation

## 2012-03-01 DIAGNOSIS — R509 Fever, unspecified: Secondary | ICD-10-CM | POA: Insufficient documentation

## 2012-03-01 DIAGNOSIS — R000 Tachycardia, unspecified: Secondary | ICD-10-CM | POA: Insufficient documentation

## 2012-03-01 MED ORDER — ACETAMINOPHEN 325 MG PO TABS
650.0000 mg | ORAL_TABLET | Freq: Once | ORAL | Status: AC
  Administered 2012-03-02: 650 mg via ORAL
  Filled 2012-03-01 (×2): qty 2

## 2012-03-01 NOTE — ED Notes (Signed)
Pt alert, arrives from home, c/o fever, right flank pain, onset a few days ago, worse today, denies changes in bowel or bladder, resp even unlabored, skin pwd

## 2012-03-02 LAB — URINALYSIS, ROUTINE W REFLEX MICROSCOPIC
Glucose, UA: NEGATIVE mg/dL
Nitrite: POSITIVE — AB
pH: 6.5 (ref 5.0–8.0)

## 2012-03-02 LAB — DIFFERENTIAL
Basophils Absolute: 0 10*3/uL (ref 0.0–0.1)
Eosinophils Absolute: 0 10*3/uL (ref 0.0–0.7)
Lymphocytes Relative: 9 % — ABNORMAL LOW (ref 12–46)
Monocytes Relative: 10 % (ref 3–12)
Neutro Abs: 8.2 10*3/uL — ABNORMAL HIGH (ref 1.7–7.7)
Neutrophils Relative %: 81 % — ABNORMAL HIGH (ref 43–77)

## 2012-03-02 LAB — CBC
HCT: 35.7 % — ABNORMAL LOW (ref 36.0–46.0)
Hemoglobin: 11.8 g/dL — ABNORMAL LOW (ref 12.0–15.0)
RDW: 13.8 % (ref 11.5–15.5)
WBC: 10.1 10*3/uL (ref 4.0–10.5)

## 2012-03-02 LAB — BASIC METABOLIC PANEL
BUN: 9 mg/dL (ref 6–23)
Chloride: 97 mEq/L (ref 96–112)
GFR calc Af Amer: >90 mL/min (ref >90)
GFR calc non Af Amer: 88 mL/min — ABNORMAL LOW (ref >90)
Potassium: 3 mEq/L — ABNORMAL LOW (ref 3.5–5.1)
Sodium: 132 mEq/L — ABNORMAL LOW (ref 135–145)

## 2012-03-02 LAB — URINE MICROSCOPIC-ADD ON

## 2012-03-02 LAB — PREGNANCY, URINE: Preg Test, Ur: NEGATIVE

## 2012-03-02 MED ORDER — PROMETHAZINE HCL 25 MG PO TABS: 25.0000 mg | ORAL_TABLET | Freq: Four times a day (QID) | ORAL | Status: DC | PRN

## 2012-03-02 MED ORDER — HYDROMORPHONE HCL PF 1 MG/ML IJ SOLN
1.0000 mg | Freq: Once | INTRAMUSCULAR | Status: AC
  Administered 2012-03-02: 1 mg via INTRAVENOUS
  Filled 2012-03-02: qty 1

## 2012-03-02 MED ORDER — CIPROFLOXACIN HCL 500 MG PO TABS: 500.0000 mg | ORAL_TABLET | Freq: Two times a day (BID) | ORAL | Status: AC

## 2012-03-02 MED ORDER — SODIUM CHLORIDE 0.9 % IV BOLUS (SEPSIS)
1000.0000 mL | Freq: Once | INTRAVENOUS | Status: AC
  Administered 2012-03-02: 1000 mL via INTRAVENOUS

## 2012-03-02 MED ORDER — HYDROCODONE-ACETAMINOPHEN 5-325 MG PO TABS: 1.0000 | ORAL_TABLET | ORAL | Status: AC | PRN

## 2012-03-02 MED ORDER — CIPROFLOXACIN HCL 500 MG PO TABS
500.0000 mg | ORAL_TABLET | Freq: Once | ORAL | Status: AC
  Administered 2012-03-02: 500 mg via ORAL
  Filled 2012-03-02: qty 1

## 2012-03-02 MED ORDER — ONDANSETRON HCL 4 MG/2ML IJ SOLN
4.0000 mg | Freq: Once | INTRAMUSCULAR | Status: AC
  Administered 2012-03-02: 4 mg via INTRAVENOUS
  Filled 2012-03-02: qty 2

## 2012-03-02 NOTE — ED Provider Notes (Signed)
Medical screening examination/treatment/procedure(s) were performed by non-physician practitioner and as supervising physician I was immediately available for consultation/collaboration.  Olivia Mackie, MD 03/02/12 [phone]

## 2012-03-02 NOTE — ED Provider Notes (Signed)
History     CSN: [id number]  Arrival date & time 03/01/12  2327   First MD Initiated Contact with Patient 03/02/12 0146      Chief Complaint  Patient presents with  . Flank Pain  . Fever    (Consider location/radiation/quality/duration/timing/severity/associated sxs/prior treatment) HPI Comments: Patient here with acute onset of right flank pain with fever and nausea starting 2 days ago - she states that she was at work when the pain began gradually and has since worsened - states that the pain has not moved - she intially thought that she had pulled a muscle in her back - she deneis abdominal pain, vaginal discharge, bleeding, dysuria, hematuria - reports no past history of any problem.s  Patient is a 26 y.o. female presenting with flank pain. The history is provided by the patient. No language interpreter was used.  Flank Pain This is a new problem. The current episode started yesterday. The problem occurs constantly. The problem has been gradually worsening. Associated symptoms include a fever, nausea and vomiting. Pertinent negatives include no abdominal pain, anorexia, arthralgias, change in bowel habit, chest pain, chills, congestion, coughing, diaphoresis, fatigue, headaches, joint swelling, myalgias, neck pain, numbness, rash, sore throat, swollen glands, urinary symptoms, vertigo, visual change or weakness. The symptoms are aggravated by nothing. She has tried nothing for the symptoms. The treatment provided no relief.    History reviewed. No pertinent past medical history.  History reviewed. No pertinent past surgical history.  No family history on file.  History  Substance Use Topics  . Smoking status: Current Everyday Smoker -- 1.0 packs/day    Types: Cigarettes  . Smokeless tobacco: Not on file  . Alcohol Use: No    OB History    Grav Para Term Preterm Abortions TAB SAB Ect Mult Living                  Review of Systems  Constitutional: Positive for fever.  Negative for chills, diaphoresis and fatigue.  HENT: Negative for congestion, sore throat and neck pain.   Respiratory: Negative for cough.   Cardiovascular: Negative for chest pain.  Gastrointestinal: Positive for nausea and vomiting. Negative for abdominal pain, anorexia and change in bowel habit.  Genitourinary: Positive for flank pain.  Musculoskeletal: Negative for myalgias, joint swelling and arthralgias.  Skin: Negative for rash.  Neurological: Negative for vertigo, weakness, numbness and headaches.  All other systems reviewed and are negative.    Allergies  Sulfamethoxazole w-trimethoprim  Home Medications  No current outpatient prescriptions on file.  BP 115/72  Pulse 130  Temp(Src) 100.4 F (38 C) (Oral)  Resp 16  Wt 157 lb (71.215 kg)  SpO2 98%  LMP 02/29/2012  Physical Exam  Nursing note and vitals reviewed. Constitutional: She is oriented to person, place, and time. She appears well-developed and well-nourished. No distress.  HENT:  Head: Normocephalic and atraumatic.  Right Ear: External ear normal.  Left Ear: External ear normal.  Nose: Nose normal.  Mouth/Throat: Oropharynx is clear and moist. No oropharyngeal exudate.  Eyes: Conjunctivae are normal. Pupils are equal, round, and reactive to light. No scleral icterus.  Neck: Normal range of motion. Neck supple.  Cardiovascular: Regular rhythm and normal heart sounds.  Exam reveals no gallop and no friction rub.   No murmur heard.      tachycardia  Pulmonary/Chest: Effort normal and breath sounds normal. No respiratory distress. She has no wheezes. She has no rales. She exhibits no tenderness.  Abdominal: Soft.  Bowel sounds are normal. She exhibits no distension and no mass. There is no tenderness. There is CVA tenderness. There is no rebound and no guarding.       Right CVA tenderness  Musculoskeletal: Normal range of motion. She exhibits no edema and no tenderness.  Lymphadenopathy:    She has no  cervical adenopathy.  Neurological: She is alert and oriented to person, place, and time. No cranial nerve deficit.  Skin: Skin is warm and dry. No rash noted. No erythema. No pallor.  Psychiatric: She has a normal mood and affect. Her behavior is normal. Judgment and thought content normal.    ED Course  Procedures (including critical care time)   Labs Reviewed  PREGNANCY, URINE  URINALYSIS, ROUTINE W REFLEX MICROSCOPIC   No results found.  Results for orders placed during the hospital encounter of 03/01/12  PREGNANCY, URINE      Component Value Range   Preg Test, Ur NEGATIVE  NEGATIVE   URINALYSIS, ROUTINE W REFLEX MICROSCOPIC      Component Value Range   Color, Urine YELLOW  YELLOW    APPearance TURBID (*) CLEAR    Specific Gravity, Urine 1.014  1.005 - 1.030    pH 6.5  5.0 - 8.0    Glucose, UA NEGATIVE  NEGATIVE (mg/dL)   Hgb urine dipstick LARGE (*) NEGATIVE    Bilirubin Urine NEGATIVE  NEGATIVE    Ketones, ur TRACE (*) NEGATIVE (mg/dL)   Protein, ur 696 (*) NEGATIVE (mg/dL)   Urobilinogen, UA 1.0  0.0 - 1.0 (mg/dL)   Nitrite POSITIVE (*) NEGATIVE    Leukocytes, UA MODERATE (*) NEGATIVE   CBC      Component Value Range   WBC 10.1  4.0 - 10.5 (K/uL)   RBC 5.10  3.87 - 5.11 (MIL/uL)   Hemoglobin 11.8 (*) 12.0 - 15.0 (g/dL)   HCT 29.5 (*) 28.4 - 46.0 (%)   MCV 70.0 (*) 78.0 - 100.0 (fL)   MCH 23.1 (*) 26.0 - 34.0 (pg)   MCHC 33.1  30.0 - 36.0 (g/dL)   RDW 13.2  44.0 - 10.2 (%)   Platelets 236  150 - 400 (K/uL)  DIFFERENTIAL      Component Value Range   Neutrophils Relative 81 (*) 43 - 77 (%)   Lymphocytes Relative 9 (*) 12 - 46 (%)   Monocytes Relative 10  3 - 12 (%)   Eosinophils Relative 0  0 - 5 (%)   Basophils Relative 0  0 - 1 (%)   Neutro Abs 8.2 (*) 1.7 - 7.7 (K/uL)   Lymphs Abs 0.9  0.7 - 4.0 (K/uL)   Monocytes Absolute 1.0  0.1 - 1.0 (K/uL)   Eosinophils Absolute 0.0  0.0 - 0.7 (K/uL)   Basophils Absolute 0.0  0.0 - 0.1 (K/uL)   RBC Morphology  TARGET CELLS    BASIC METABOLIC PANEL      Component Value Range   Sodium 132 (*) 135 - 145 (mEq/L)   Potassium 3.0 (*) 3.5 - 5.1 (mEq/L)   Chloride 97  96 - 112 (mEq/L)   CO2 21  19 - 32 (mEq/L)   Glucose, Bld 129 (*) 70 - 99 (mg/dL)   BUN 9  6 - 23 (mg/dL)   Creatinine, Ser 7.25  0.50 - 1.10 (mg/dL)   Calcium 8.8  8.4 - 36.6 (mg/dL)   GFR calc non Af Amer 88 (*) >90 (mL/min)   GFR calc Af Amer >90  >90 (mL/min)  URINE MICROSCOPIC-ADD ON      Component Value Range   Squamous Epithelial / LPF RARE  RARE    WBC, UA TOO NUMEROUS TO COUNT  <3 (WBC/hpf)   RBC / HPF 21-50  <3 (RBC/hpf)   Bacteria, UA MANY (*) RARE    Urine-Other FIELD OBSCURED BY WBC'S     No results found.   Right Pyelonephritis    MDM  Patient here with gradual onset of right flank pain with fever and many bacteria in urine - no evidence of vaginal symptoms, HR decreased to normal here after IV fluids - is able to take in oral medication will start abx here and discharge home.        Izola Price [place], [place] 03/02/12 [phone]

## 2012-03-02 NOTE — ED Notes (Signed)
Patient aware of need for urine testing. Patient unable to void at this time. Patient encouraged to call for toileting assistance  

## 2012-03-02 NOTE — Discharge Instructions (Signed)
Pyelonephritis, Adult Pyelonephritis is a kidney infection. A kidney infection can happen quickly, or it can last for a long time. HOME CARE   Take your medicine (antibiotics) as told. Finish it even if you start to feel better.   Keep all doctor visits as told.   Drink enough fluids to keep your pee (urine) clear or pale yellow.   Only take medicine as told by your doctor.  GET HELP RIGHT AWAY IF:   You have a fever.   You cannot take your medicine or drink fluids as told.   You have chills and shaking.   You feel very weak or pass out (faint).   You do not feel better after 2 days.  MAKE SURE YOU:  Understand these instructions.   Will watch your condition.   Will get help right away if you are not doing well or get worse.  Document Released: 11/14/2004 Document Revised: 09/26/2011 Document Reviewed: 03/27/2011 [facility] Patient Information 2012 [place], [place].Pyelonephritis, Adult Pyelonephritis is a kidney infection. In general, there are 2 main types of pyelonephritis:  Infections that come on quickly without any warning (acute pyelonephritis).   Infections that persist for a long period of time (chronic pyelonephritis).  CAUSES  Two main causes of pyelonephritis are:  Bacteria traveling from the bladder to the kidney. This is a problem especially in pregnant women. The urine in the bladder can become filled with bacteria from multiple causes, including:   Inflammation of the prostate gland (prostatitis).   Sexual intercourse in females.   Bladder infection (cystitis).   Bacteria traveling from the bloodstream to the tissue part of the kidney.  Problems that may increase your risk of getting a kidney infection include:  Diabetes.   Kidney stones or bladder stones.   Cancer.   Catheters placed in the bladder.   Other abnormalities of the kidney or ureter.  SYMPTOMS   Abdominal pain.   Pain in the side or flank area.   Fever.   Chills.   Upset  stomach.   Blood in the urine (dark urine).   Frequent urination.   Strong or persistent urge to urinate.   Burning or stinging when urinating.  DIAGNOSIS  Your caregiver may diagnose your kidney infection based on your symptoms. A urine sample may also be taken. TREATMENT  In general, treatment depends on how severe the infection is.   If the infection is mild and caught early, your caregiver may treat you with oral antibiotics and send you home.   If the infection is more severe, the bacteria may have gotten into the bloodstream. This will require intravenous (IV) antibiotics and a hospital stay. Symptoms may include:   High fever.   Severe flank pain.   Shaking chills.   Even after a hospital stay, your caregiver may require you to be on oral antibiotics for a period of time.   Other treatments may be required depending upon the cause of the infection.  HOME CARE INSTRUCTIONS   Take your antibiotics as directed. Finish them even if you start to feel better.   Make an appointment to have your urine checked to make sure the infection is gone.   Drink enough fluids to keep your urine clear or pale yellow.   Take medicines for the bladder if you have urgency and frequency of urination as directed by your caregiver.  SEEK IMMEDIATE MEDICAL CARE IF:   You have a fever.   You are unable to take your antibiotics or  fluids.   You develop shaking chills.   You experience extreme weakness or fainting.   There is no improvement after 2 days of treatment.  MAKE SURE YOU:  Understand these instructions.   Will watch your condition.   Will get help right away if you are not doing well or get worse.  Document Released: 10/07/2005 Document Revised: 09/26/2011 Document Reviewed: 03/13/2011 [facility] Patient Information 2012 [place], [place].

## 2012-03-04 LAB — URINE CULTURE
Colony Count: >=100000
Culture  Setup Time: 201305130935
Special Requests: NORMAL

## 2012-03-05 NOTE — ED Notes (Signed)
+   urine Patient treated with cipro-sensitive to same-chart appended per protocol MD. 

## 2012-11-18 ENCOUNTER — Inpatient Hospital Stay (HOSPITAL_COMMUNITY): Payer: Medicaid Other

## 2012-11-18 ENCOUNTER — Inpatient Hospital Stay (HOSPITAL_COMMUNITY)
Admission: AD | Admit: 2012-11-18 | Discharge: 2012-11-18 | Disposition: A | Discharge status: Home / Self Care | Payer: Medicaid Other | Source: Ambulatory Visit | Attending: Obstetrics & Gynecology | Admitting: Obstetrics & Gynecology

## 2012-11-18 ENCOUNTER — Encounter (HOSPITAL_COMMUNITY): Payer: Self-pay

## 2012-11-18 DIAGNOSIS — E162 Hypoglycemia, unspecified: Secondary | ICD-10-CM

## 2012-11-18 DIAGNOSIS — A5901 Trichomonal vulvovaginitis: Secondary | ICD-10-CM | POA: Insufficient documentation

## 2012-11-18 DIAGNOSIS — A599 Trichomoniasis, unspecified: Secondary | ICD-10-CM | POA: Diagnosis present

## 2012-11-18 DIAGNOSIS — A5900 Urogenital trichomoniasis, unspecified: Secondary | ICD-10-CM

## 2012-11-18 DIAGNOSIS — R42 Dizziness and giddiness: Secondary | ICD-10-CM

## 2012-11-18 DIAGNOSIS — R55 Syncope and collapse: Secondary | ICD-10-CM

## 2012-11-18 DIAGNOSIS — R000 Tachycardia, unspecified: Secondary | ICD-10-CM

## 2012-11-18 DIAGNOSIS — O98819 Other maternal infectious and parasitic diseases complicating pregnancy, unspecified trimester: Secondary | ICD-10-CM | POA: Insufficient documentation

## 2012-11-18 DIAGNOSIS — O98519 Other viral diseases complicating pregnancy, unspecified trimester: Secondary | ICD-10-CM | POA: Insufficient documentation

## 2012-11-18 DIAGNOSIS — O2650 Maternal hypotension syndrome, unspecified trimester: Secondary | ICD-10-CM | POA: Insufficient documentation

## 2012-11-18 DIAGNOSIS — O0990 Supervision of high risk pregnancy, unspecified, unspecified trimester: Secondary | ICD-10-CM

## 2012-11-18 DIAGNOSIS — D649 Anemia, unspecified: Secondary | ICD-10-CM | POA: Insufficient documentation

## 2012-11-18 DIAGNOSIS — Z21 Asymptomatic human immunodeficiency virus [HIV] infection status: Secondary | ICD-10-CM | POA: Insufficient documentation

## 2012-11-18 DIAGNOSIS — O99019 Anemia complicating pregnancy, unspecified trimester: Secondary | ICD-10-CM | POA: Insufficient documentation

## 2012-11-18 HISTORY — DX: Renal tubulo-interstitial disease, unspecified: N15.9

## 2012-11-18 HISTORY — DX: Human immunodeficiency virus (HIV) disease: B20

## 2012-11-18 HISTORY — DX: Personal history of other complications of pregnancy, childbirth and the puerperium: Z87.59

## 2012-11-18 LAB — URINE MICROSCOPIC-ADD ON

## 2012-11-18 LAB — CBC
HCT: 30.8 % — ABNORMAL LOW (ref 36.0–46.0)
MCH: 22.6 pg — ABNORMAL LOW (ref 26.0–34.0)
MCHC: 31.5 g/dL (ref 30.0–36.0)
MCV: 71.6 fL — ABNORMAL LOW (ref 78.0–100.0)
RDW: 15.3 % (ref 11.5–15.5)

## 2012-11-18 LAB — URINALYSIS, ROUTINE W REFLEX MICROSCOPIC
Nitrite: NEGATIVE
Protein, ur: NEGATIVE mg/dL
Specific Gravity, Urine: 1.015 (ref 1.005–1.030)
Urobilinogen, UA: 0.2 mg/dL (ref 0.0–1.0)

## 2012-11-18 LAB — COMPREHENSIVE METABOLIC PANEL
ALT: 12 U/L (ref 0–35)
AST: 28 U/L (ref 0–37)
Albumin: 2.6 g/dL — ABNORMAL LOW (ref 3.5–5.2)
Alkaline Phosphatase: 78 U/L (ref 39–117)
Chloride: 100 mEq/L (ref 96–112)
Potassium: 3.7 mEq/L (ref 3.5–5.1)
Sodium: 133 mEq/L — ABNORMAL LOW (ref 135–145)
Total Bilirubin: 0.3 mg/dL (ref 0.3–1.2)
Total Protein: 7.3 g/dL (ref 6.0–8.3)

## 2012-11-18 LAB — RPR: RPR Ser Ql: NONREACTIVE

## 2012-11-18 LAB — TYPE AND SCREEN
DAT, IgG: NEGATIVE
PT AG Type: NEGATIVE

## 2012-11-18 MED ORDER — METRONIDAZOLE 500 MG PO TABS
2000.0000 mg | ORAL_TABLET | Freq: Once | ORAL | Status: AC
  Administered 2012-11-18: 2000 mg via ORAL
  Filled 2012-11-18: qty 4

## 2012-11-18 NOTE — MAU Note (Signed)
Per Tresa Endo in [place], unable to get dating/measurements with limited u/s, RN needs to change [place] to ob complete

## 2012-11-18 NOTE — MAU Provider Note (Addendum)
History   Ms. Spear is a 27 y.o. [id number] at Unknown [place] with HIV who presents to the MAU due to a syncopal episode this morning. 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 At that point, she called for help, some one eased her to the floor, and she awoke on the floor of the break room. She was told she was "out" for 1-2 minutes, but did not lose bowel or bladder continence. She did have a similar episode with a previous pregnancy, but never when she is not pregnant. She has had no prenatal care during this pregnancy and is unsure of dates. She does believe conception occurred Late July-August. She has not noted any bleeding, loss of fluid, and continues to feel good fetal movement.    CSN: [id number]  Arrival date and time: 11/18/12 1132   First Provider Initiated Contact with Patient 11/18/12 1250      Chief Complaint  Patient presents with  . Loss of Consciousness  . Emesis   Loss of Consciousness This is a new problem. The current episode started today. The problem has been resolved. She lost consciousness for a period of 1 to 5 minutes. The symptoms are aggravated by standing. Associated symptoms include dizziness. Pertinent negatives include no chest pain, fever, headaches, nausea, palpitations or vomiting.  Emesis  Associated symptoms include coughing and dizziness. Pertinent negatives include no chest pain, diarrhea, fever or headaches.    OB History    Grav Para Term Preterm Abortions TAB SAB Ect Mult Living   5 5 5       3       Past Medical History  Diagnosis Date  . HIV (human immunodeficiency virus infection)   . History of stillbirth   . Headache   . Kidney infection     Recurrent    Past Surgical History  Procedure Date  . No past surgeries     Family History  Problem Relation Age of Onset  . Other Neg Hx     History  Substance Use Topics  . Smoking status: Former  Smoker -- 1.0 packs/day    Types: Cigarettes  . Smokeless tobacco: Never Used  . Alcohol Use: No    Allergies:  Allergies  Allergen Reactions  . Sulfamethoxazole W-Trimethoprim     REACTION: rash    Prescriptions prior to admission  Medication Sig Dispense Refill  . acetaminophen (TYLENOL) 500 MG tablet Take 1,000 mg by mouth every 6 (six) hours as needed. For pain      . lamiVUDine-zidovudine (COMBIVIR) 150-300 MG per tablet Take 3 tablets by mouth 2 (two) times daily.      Marland Kitchen lopinavir-ritonavir (KALETRA) 200-50 MG per tablet Take 1 tablet by mouth every morning.        Review of Systems  Constitutional: Negative for fever.  HENT: Positive for congestion.   Eyes: Negative for blurred vision.  Respiratory: Positive for cough. Negative for shortness of breath and wheezing.   Cardiovascular: Positive for syncope. Negative for chest pain and palpitations.  Gastrointestinal: Positive for heartburn. Negative for nausea, vomiting, diarrhea and constipation.  Genitourinary: Negative for dysuria and hematuria.  Neurological: Positive for dizziness and loss of consciousness. Negative for tingling, seizures and headaches.  Psychiatric/Behavioral: Negative for depression.   Physical Exam   Blood pressure 119/88, pulse 103, temperature 98.3 F (36.8 C), temperature source Oral, resp. rate 16, height 5\' 5"  (1.651 m),  weight 75.025 kg (165 lb 6.4 oz), SpO2 100.00%, unknown if currently breastfeeding.  Physical Exam  Constitutional: She is oriented to person, place, and time. She appears well-developed and well-nourished. No distress.  HENT:  Head: Normocephalic and atraumatic.  Eyes: Conjunctivae normal are normal. Pupils are equal, round, and reactive to light.  Neck: Normal range of motion. Neck supple.  Cardiovascular: Regular rhythm, normal heart sounds and intact distal pulses.   No murmur heard.      tachycardic  GI: There is no tenderness.       Gravid, 36 cm fundal height    Musculoskeletal: Normal range of motion. She exhibits no edema and no tenderness.  Lymphadenopathy:    She has no cervical adenopathy.  Neurological: She is alert and oriented to person, place, and time. No cranial nerve deficit.  Skin: Skin is warm and dry.  Psychiatric: She has a normal mood and affect. Her behavior is normal.   FHT: 145, + accels, no decels Toco: no contractions noted initially, then every 2-4 min  Filed Vitals:   11/18/12 1816  BP: 122/72  Pulse: 102  Temp:   Resp: 16     MAU Course  Procedures  Results for orders placed during the hospital encounter of 11/18/12 (from the past 24 hour(s))  URINALYSIS, ROUTINE W REFLEX MICROSCOPIC     Status: Abnormal   Collection Time   11/18/12 12:05 PM      Component Value Range   Color, Urine YELLOW  YELLOW   APPearance CLOUDY (*) CLEAR   Specific Gravity, Urine 1.015  1.005 - 1.030   pH 7.0  5.0 - 8.0   Glucose, UA NEGATIVE  NEGATIVE mg/dL   Hgb urine dipstick MODERATE (*) NEGATIVE   Bilirubin Urine NEGATIVE  NEGATIVE   Ketones, ur 15 (*) NEGATIVE mg/dL   Protein, ur NEGATIVE  NEGATIVE mg/dL   Urobilinogen, UA 0.2  0.0 - 1.0 mg/dL   Nitrite NEGATIVE  NEGATIVE   Leukocytes, UA LARGE (*) NEGATIVE  URINE MICROSCOPIC-ADD ON     Status: Abnormal   Collection Time   11/18/12 12:05 PM      Component Value Range   Squamous Epithelial / LPF FEW (*) RARE   WBC, UA 21-50  <3 WBC/hpf   RBC / HPF 0-2  <3 RBC/hpf   Bacteria, UA FEW (*) RARE   Urine-Other TRICHOMONAS PRESENT    COMPREHENSIVE METABOLIC PANEL     Status: Abnormal   Collection Time   11/18/12  1:26 PM      Component Value Range   Sodium 133 (*) 135 - 145 mEq/L   Potassium 3.7  3.5 - 5.1 mEq/L   Chloride 100  96 - 112 mEq/L   CO2 22  19 - 32 mEq/L   Glucose, Bld 70  70 - 99 mg/dL   BUN 5 (*) 6 - 23 mg/dL   Creatinine, Ser 0.45  0.50 - 1.10 mg/dL   Calcium 8.7  8.4 - 40.9 mg/dL   Total Protein 7.3  6.0 - 8.3 g/dL   Albumin 2.6 (*) 3.5 - 5.2 g/dL   AST  28  0 - 37 U/L   ALT 12  0 - 35 U/L   Alkaline Phosphatase 78  39 - 117 U/L   Total Bilirubin 0.3  0.3 - 1.2 mg/dL   GFR calc non Af Amer >90  >90 mL/min   GFR calc Af Amer >90  >90 mL/min  CBC     Status: Abnormal  Collection Time   11/18/12  1:26 PM      Component Value Range   WBC 5.5  4.0 - 10.5 K/uL   RBC 4.30  3.87 - 5.11 MIL/uL   Hemoglobin 9.7 (*) 12.0 - 15.0 g/dL   HCT 16.1 (*) 09.6 - 04.5 %   MCV 71.6 (*) 78.0 - 100.0 fL   MCH 22.6 (*) 26.0 - 34.0 pg   MCHC 31.5  30.0 - 36.0 g/dL   RDW 40.9  81.1 - 91.4 %   Platelets 198  150 - 400 K/uL  TYPE AND SCREEN     Status: Normal (Preliminary result)   Collection Time   11/18/12  1:26 PM      Component Value Range   ABO/RH(D) O POS     Antibody Screen PENDING     Sample Expiration 11/21/2012     OB US for dating: 33.3, Breech, Placenta Anterior & above os  Assessment and Plan  Ms. Cavenaugh is a 27 y.o. [id number] at [redacted]w[redacted]d who presents for a single episode of syncope today - vitals normal, cbc & cmp reassuring, however mildly anemic, and borderline hypoglycemic. Dicussed with patient and will start on PNV. - urine not indicative of dehydration, however was positive for trichomonas. Treated with one dose Flagyl -orthostatic vitals positive, discussed precautions with patient - [place] today to establish dates, preliminary labs ordered, will establish care at [facility] I have seen and examined this patient and agree the above assessment. CRESENZO-DISHMAN,Keairra Bardon 11/18/2012 9:54 PM    CONROY, LOUISA 11/18/2012, 1:25 PM

## 2012-11-18 NOTE — MAU Note (Signed)
Patient states she was at work at 0800 and got hot and was assisted to the break room. Patient states she passed out and when she became alert she was sitting on the floor. Someone had assisted her into the position. Had one episode of vomiting after the episode. Patient has had no prenatal care. Denies any bleeding or leaking and has been feeling fetal movement since late December.

## 2012-11-18 NOTE — MAU Note (Signed)
Pt states was working in [employer] and got hot, took of jacket, and associates assisted her to floor, pt did not fall, passed out completely. Denies bleeding or gush of fluid.

## 2012-11-19 LAB — URINE CULTURE
Colony Count: NO GROWTH
Culture: NO GROWTH

## 2012-11-23 NOTE — MAU Provider Note (Signed)
Attestation of Attending Supervision of Advanced Practitioner: Evaluation and management procedures were performed by the PA/NP/CNM/OB Fellow under my supervision/collaboration. Chart reviewed and agree with management and plan.  Anndrea Mihelich V 11/23/2012 6:30 PM

## 2012-11-24 ENCOUNTER — Encounter: Payer: Self-pay | Admitting: Obstetrics & Gynecology

## 2012-11-28 ENCOUNTER — Encounter: Payer: Self-pay | Admitting: Family

## 2012-12-14 ENCOUNTER — Other Ambulatory Visit: Payer: Self-pay | Admitting: Obstetrics & Gynecology

## 2012-12-14 ENCOUNTER — Telehealth: Payer: Self-pay | Admitting: Infectious Disease

## 2012-12-14 ENCOUNTER — Ambulatory Visit (INDEPENDENT_AMBULATORY_CARE_PROVIDER_SITE_OTHER): Payer: Self-pay | Admitting: Family Medicine

## 2012-12-14 ENCOUNTER — Encounter: Payer: Self-pay | Admitting: Family Medicine

## 2012-12-14 VITALS — BP 110/75 | Temp 98.5°F | Wt 164.5 lb

## 2012-12-14 DIAGNOSIS — O09299 Supervision of pregnancy with other poor reproductive or obstetric history, unspecified trimester: Secondary | ICD-10-CM | POA: Insufficient documentation

## 2012-12-14 DIAGNOSIS — O0933 Supervision of pregnancy with insufficient antenatal care, third trimester: Secondary | ICD-10-CM

## 2012-12-14 DIAGNOSIS — O0930 Supervision of pregnancy with insufficient antenatal care, unspecified trimester: Secondary | ICD-10-CM | POA: Insufficient documentation

## 2012-12-14 DIAGNOSIS — O98519 Other viral diseases complicating pregnancy, unspecified trimester: Secondary | ICD-10-CM

## 2012-12-14 DIAGNOSIS — O09293 Supervision of pregnancy with other poor reproductive or obstetric history, third trimester: Secondary | ICD-10-CM

## 2012-12-14 DIAGNOSIS — B20 Human immunodeficiency virus [HIV] disease: Secondary | ICD-10-CM

## 2012-12-14 DIAGNOSIS — O0993 Supervision of high risk pregnancy, unspecified, third trimester: Secondary | ICD-10-CM

## 2012-12-14 LAB — POCT URINALYSIS DIP (DEVICE)
Glucose, UA: NEGATIVE mg/dL
Hgb urine dipstick: NEGATIVE
Nitrite: NEGATIVE
Urobilinogen, UA: 0.2 mg/dL (ref 0.0–1.0)

## 2012-12-14 MED ORDER — ATAZANAVIR SULFATE 300 MG PO CAPS: 300.0000 mg | ORAL_CAPSULE | Freq: Every day | ORAL | Status: DC

## 2012-12-14 MED ORDER — EMTRICITABINE-TENOFOVIR DF 200-300 MG PO TABS: 1.0000 | ORAL_TABLET | Freq: Every day | ORAL | Status: DC

## 2012-12-14 MED ORDER — RITONAVIR 100 MG PO TABS: 100.0000 mg | ORAL_TABLET | Freq: Every day | ORAL | Status: DC

## 2012-12-14 MED ORDER — PRENATAL VITAMINS 0.8 MG PO TABS: 1.0000 | ORAL_TABLET | Freq: Every day | ORAL | Status: DC

## 2012-12-14 NOTE — Telephone Encounter (Signed)
I think she should be seen tomorrow/wed at latest. She is ultra close to delivering so sooner she can get onto [facility] the better. I think Marcelino Duster is working on this right now with you? THanks

## 2012-12-14 NOTE — Patient Instructions (Signed)
Pregnancy - Third Trimester  The third trimester of pregnancy (the last 3 months) is a period of the most rapid growth for you and your baby. The baby approaches a length of 20 inches and a weight of 6 to 10 pounds. The baby is adding on fat and getting ready for life outside your body. While inside, babies have periods of sleeping and waking, suck their thumbs, and hiccups. You can often feel small contractions of the uterus. This is false labor. It is also called Braxton-Hicks contractions. This is like a practice for labor. The usual problems in this stage of pregnancy include more difficulty breathing, swelling of the hands and feet from water retention, and having to urinate more often because of the uterus and baby pressing on your bladder.   PRENATAL EXAMS  · Blood work may continue to be done during prenatal exams. These tests are done to check on your health and the probable health of your baby. Blood work is used to follow your blood levels (hemoglobin). Anemia (low hemoglobin) is common during pregnancy. Iron and vitamins are given to help prevent this. You may also continue to be checked for diabetes. Some of the past blood tests may be done again.  · The size of the uterus is measured during each visit. This makes sure your baby is growing properly according to your pregnancy dates.  · Your blood pressure is checked every prenatal visit. This is to make sure you are not getting toxemia.  · Your urine is checked every prenatal visit for infection, diabetes and protein.  · Your weight is checked at each visit. This is done to make sure gains are happening at the suggested rate and that you and your baby are growing normally.  · Sometimes, an ultrasound is performed to confirm the position and the proper growth and development of the baby. This is a test done that bounces harmless sound waves off the baby so your caregiver can more accurately determine due dates.  · Discuss the type of pain medication and  anesthesia you will have during your labor and delivery.  · Discuss the possibility and anesthesia if a Cesarean Section might be necessary.  · Inform your caregiver if there is any mental or physical violence at home.  Sometimes, a specialized non-stress test, contraction stress test and biophysical profile are done to make sure the baby is not having a problem. Checking the amniotic fluid surrounding the baby is called an amniocentesis. The amniotic fluid is removed by sticking a needle into the belly (abdomen). This is sometimes done near the end of pregnancy if an early delivery is required. In this case, it is done to help make sure the baby's lungs are mature enough for the baby to live outside of the womb. If the lungs are not mature and it is unsafe to deliver the baby, an injection of cortisone medication is given to the mother 1 to 2 days before the delivery. This helps the baby's lungs mature and makes it safer to deliver the baby.  CHANGES OCCURING IN THE THIRD TRIMESTER OF PREGNANCY  Your body goes through many changes during pregnancy. They vary from person to person. Talk to your caregiver about changes you notice and are concerned about.  · During the last trimester, you have probably had an increase in your appetite. It is normal to have cravings for certain foods. This varies from person to person and pregnancy to pregnancy.  · You may begin to   get stretch marks on your hips, abdomen, and breasts. These are normal changes in the body during pregnancy. There are no exercises or medications to take which prevent this change.  · Constipation may be treated with a stool softener or adding bulk to your diet. Drinking lots of fluids, fiber in vegetables, fruits, and whole grains are helpful.  · Exercising is also helpful. If you have been very active up until your pregnancy, most of these activities can be continued during your pregnancy. If you have been less active, it is helpful to start an exercise  program such as walking. Consult your caregiver before starting exercise programs.  · Avoid all smoking, alcohol, un-prescribed drugs, herbs and "street drugs" during your pregnancy. These chemicals affect the formation and growth of the baby. Avoid chemicals throughout the pregnancy to ensure the delivery of a healthy infant.  · Backache, varicose veins and hemorrhoids may develop or get worse.  · You will tire more easily in the third trimester, which is normal.  · The baby's movements may be stronger and more often.  · You may become short of breath easily.  · Your belly button may stick out.  · A yellow discharge may leak from your breasts called colostrum.  · You may have a bloody mucus discharge. This usually occurs a few days to a week before labor begins.  HOME CARE INSTRUCTIONS   · Keep your caregiver's appointments. Follow your caregiver's instructions regarding medication use, exercise, and diet.  · During pregnancy, you are providing food for you and your baby. Continue to eat regular, well-balanced meals. Choose foods such as meat, fish, milk and other low fat dairy products, vegetables, fruits, and whole-grain breads and cereals. Your caregiver will tell you of the ideal weight gain.  · A physical sexual relationship may be continued throughout pregnancy if there are no other problems such as early (premature) leaking of amniotic fluid from the membranes, vaginal bleeding, or belly (abdominal) pain.  · Exercise regularly if there are no restrictions. Check with your caregiver if you are unsure of the safety of your exercises. Greater weight gain will occur in the last 2 trimesters of pregnancy. Exercising helps:  · Control your weight.  · Get you in shape for labor and delivery.  · You lose weight after you deliver.  · Rest a lot with legs elevated, or as needed for leg cramps or low back pain.  · Wear a good support or jogging bra for breast tenderness during pregnancy. This may help if worn during  sleep. Pads or tissues may be used in the bra if you are leaking colostrum.  · Do not use hot tubs, steam rooms, or saunas.  · Wear your seat belt when driving. This protects you and your baby if you are in an accident.  · Avoid raw meat, cat litter boxes and soil used by cats. These carry germs that can cause birth defects in the baby.  · It is easier to loose urine during pregnancy. Tightening up and strengthening the pelvic muscles will help with this problem. You can practice stopping your urination while you are going to the bathroom. These are the same muscles you need to strengthen. It is also the muscles you would use if you were trying to stop from passing gas. You can practice tightening these muscles up 10 times a set and repeating this about 3 times per day. Once you know what muscles to tighten up, do not perform these   exercises during urination. It is more likely to cause an infection by backing up the urine.  · Ask for help if you have financial, counseling or nutritional needs during pregnancy. Your caregiver will be able to offer counseling for these needs as well as refer you for other special needs.  · Make a list of emergency phone numbers and have them available.  · Plan on getting help from family or friends when you go home from the hospital.  · Make a trial run to the hospital.  · Take prenatal classes with the father to understand, practice and ask questions about the labor and delivery.  · Prepare the baby's room/nursery.  · Do not travel out of the city unless it is absolutely necessary and with the advice of your caregiver.  · Wear only low or no heal shoes to have better balance and prevent falling.  MEDICATIONS AND DRUG USE IN PREGNANCY  · Take prenatal vitamins as directed. The vitamin should contain 1 milligram of folic acid. Keep all vitamins out of reach of children. Only a couple vitamins or tablets containing iron may be fatal to a baby or young child when ingested.  · Avoid use  of all medications, including herbs, over-the-counter medications, not prescribed or suggested by your caregiver. Only take over-the-counter or prescription medicines for pain, discomfort, or fever as directed by your caregiver. Do not use aspirin, ibuprofen (Motrin®, Advil®, Nuprin®) or naproxen (Aleve®) unless OK'd by your caregiver.  · Let your caregiver also know about herbs you may be using.  · Alcohol is related to a number of birth defects. This includes fetal alcohol syndrome. All alcohol, in any form, should be avoided completely. Smoking will cause low birth rate and premature babies.  · Street/illegal drugs are very harmful to the baby. They are absolutely forbidden. A baby born to an addicted mother will be addicted at birth. The baby will go through the same withdrawal an adult does.  SEEK MEDICAL CARE IF:  You have any concerns or worries during your pregnancy. It is better to call with your questions if you feel they cannot wait, rather than worry about them.  DECISIONS ABOUT CIRCUMCISION  You may or may not know the sex of your baby. If you know your baby is a boy, it may be time to think about circumcision. Circumcision is the removal of the foreskin of the penis. This is the skin that covers the sensitive end of the penis. There is no proven medical need for this. Often this decision is made on what is popular at the time or based upon religious beliefs and social issues. You can discuss these issues with your caregiver or pediatrician.  SEEK IMMEDIATE MEDICAL CARE IF:   · An unexplained oral temperature above 102° F (38.9° C) develops, or as your caregiver suggests.  · You have leaking of fluid from the vagina (birth canal). If leaking membranes are suspected, take your temperature and tell your caregiver of this when you call.  · There is vaginal spotting, bleeding or passing clots. Tell your caregiver of the amount and how many pads are used.  · You develop a bad smelling vaginal discharge with  a change in the color from clear to white.  · You develop vomiting that lasts more than 24 hours.  · You develop chills or fever.  · You develop shortness of breath.  · You develop burning on urination.  · You loose more than 2 pounds of weight   or gain more than 2 pounds of weight or as suggested by your caregiver.  · You notice sudden swelling of your face, hands, and feet or legs.  · You develop belly (abdominal) pain. Round ligament discomfort is a common non-cancerous (benign) cause of abdominal pain in pregnancy. Your caregiver still must evaluate you.  · You develop a severe headache that does not go away.  · You develop visual problems, blurred or double vision.  · If you have not felt your baby move for more than 1 hour. If you think the baby is not moving as much as usual, eat something with sugar in it and lie down on your left side for an hour. The baby should move at least 4 to 5 times per hour. Call right away if your baby moves less than that.  · You fall, are in a car accident or any kind of trauma.  · There is mental or physical violence at home.  Document Released: 10/01/2001 Document Revised: 12/30/2011 Document Reviewed: 04/05/2009  [employer]® Patient Information ©2013 [employer].

## 2012-12-14 NOTE — Progress Notes (Signed)
Patient c/o irritation possibly related to the vaginal discharge. Tested + for trich, took medication in [facility].

## 2012-12-14 NOTE — Assessment & Plan Note (Signed)
Viral load and CD4 count ordered stat. Spoke with Dr. Orvan Falconer who will try to get pt appt ASAP. Has tentative appt 12/22/12 at 2:45. Likely will need c-section at 39 weeks.

## 2012-12-14 NOTE — Progress Notes (Signed)
Subjective:    Catherine Martinez is being seen today for her first obstetrical visit.  This is not a planned pregnancy, states she had a "condom mishap." She is at [redacted]w[redacted]d gestation by 33 week ultrasound, LMP unknown/uncertain/not consistent. Her obstetrical history is significant for HIV infection not currently on treatment, late prenatal care, and history IUFD at term. Relationship with FOB: significant other, not living together. New partner, first baby with this partner. Patient does not intend to breast feed. Pregnancy history fully reviewed. 4 prior SVDs. First pregnancy with IUFD at term, delivered by SVD at 38 weeks. No bleeding, loss of fluid. Baby moving normally. Braxton-Hicks ctx.  Wants BTL.  Pt diagnosed with HIV last pregnancy and on combavir + kaletra. Was being seen by Dr. Brayton El ([facility] inf dis). Has not seen infectious disease doctor since 2011 (because Medicaid ended) but states only ran out of medications 5 months ago. No fever/chills/sweats/cough. Does c/o shortness of breath with pregnancy.  Denies nausea, vomiting, diarrhea, constipation. Hx headache, but none today. Hx kidney infections in October and January. Hospitalized in October but treated outpatient in January. Not currently on medication; denies dysuria or flank pain.  Hx chlamydia and trichomonas last pregnancy. No history or exposure to HSV. Last pap smear with last pregnancy (2011), normal. No hx abnl paps. Does c/o vaginal itching and white discharge today.  Menstrual History: OB History   Grav Para Term Preterm Abortions TAB SAB Ect Mult Living   5 4 4       3       No LMP recorded. Patient is pregnant. Does not recall. On u/s listed at 02/29/12.    The following portions of the patient's history were reviewed and updated as appropriate: allergies, current medications, past family history, past medical history, past social history, past surgical history and problem list.  Review of Systems Pertinent items are noted in  HPI.    Objective:    BP 110/75  Temp(Src) 98.5 F (36.9 C)  Wt 164 lb 8 oz (74.617 kg)  BMI 27.37 kg/m2  General Appearance:    Alert, cooperative, no distress, appears stated age  Head:    Normocephalic, without obvious abnormality, atraumatic  Eyes:    conjunctiva/corneas clear, EOM's intact  Neck:   Supple, symmetrical, trachea midline, no adenopathy;    thyroid:  no enlargement/tenderness/nodules  Back:     Symmetric, no curvature, ROM normal, no CVA tenderness  Lungs:     Clear to auscultation bilaterally, respirations unlabored   Heart:    Regular rate and rhythm, S1 and S2 normal, no murmur  Abdomen:     Soft, non-tender, bowel sounds active; FH 35 cm  Genitalia:    Normal external genitalia. Normal vagina, thick white discharge. Cervix friable, bleeding with pap. FT/long/soft. No CMT or adnexal tenderness.  Extremities:   Extremities normal, atraumatic, no cyanosis or edema  Skin:   Skin color, texture, turgor normal, no rashes or lesions  Lymph nodes:   Cervical, supraclavicular, and axillary nodes normal  Neurologic:   No focal deficits      Assessment:    Pregnancy at 37 and 1/7 weeks  HIV infection History IUFD at 38 weeks Late prenatal care   Plan:    Initial labs drawn including 1 hour GTT, pap, GC/Chlamydia and wet prep; prenatal vitamins. Called [facility] to arrange appt. HIV viral load and CD4 count ordered.   Requested c-section for 2/10 (39 weeks) Problem list reviewed and updated. AFP3 discussed: too  late. Role of ultrasound in pregnancy discussed; fetal survey: results reviewed. Amniocentesis discussed: not indicated. Follow up in 1 week. 50% of 45 min visit spent on counseling and coordination of care.

## 2012-12-14 NOTE — Telephone Encounter (Signed)
Dr. Daiva Eves, They called earlier and we gave an appt for 3/4 with Dr Drue Second.  Is this okay or do you want her seen this week? Thanks Asher Muir

## 2012-12-14 NOTE — Telephone Encounter (Signed)
Dr Thad Ranger called re Ms Grunden she is [redacted] weeks pregnant and OFF ARV.  We need to get her into [facility] ASAP. I could see tomorrow afternoon or Wed  I would like to get her onto reyataz, norvir and [place]

## 2012-12-15 ENCOUNTER — Ambulatory Visit (INDEPENDENT_AMBULATORY_CARE_PROVIDER_SITE_OTHER): Payer: Self-pay | Admitting: Infectious Disease

## 2012-12-15 ENCOUNTER — Encounter: Payer: Self-pay | Admitting: Infectious Disease

## 2012-12-15 VITALS — BP 126/78 | HR 116 | Temp 98.4°F | Ht 64.0 in | Wt 163.2 lb

## 2012-12-15 DIAGNOSIS — B20 Human immunodeficiency virus [HIV] disease: Secondary | ICD-10-CM

## 2012-12-15 DIAGNOSIS — O0990 Supervision of high risk pregnancy, unspecified, unspecified trimester: Secondary | ICD-10-CM

## 2012-12-15 DIAGNOSIS — O09899 Supervision of other high risk pregnancies, unspecified trimester: Secondary | ICD-10-CM

## 2012-12-15 DIAGNOSIS — A599 Trichomoniasis, unspecified: Secondary | ICD-10-CM

## 2012-12-15 DIAGNOSIS — O09893 Supervision of other high risk pregnancies, third trimester: Secondary | ICD-10-CM

## 2012-12-15 DIAGNOSIS — O0993 Supervision of high risk pregnancy, unspecified, third trimester: Secondary | ICD-10-CM

## 2012-12-15 DIAGNOSIS — R55 Syncope and collapse: Secondary | ICD-10-CM

## 2012-12-15 LAB — OBSTETRIC PANEL
Antibody Screen: NEGATIVE
Basophils Absolute: 0 10*3/uL (ref 0.0–0.1)
Eosinophils Relative: 1 % (ref 0–5)
HCT: 28.3 % — ABNORMAL LOW (ref 36.0–46.0)
Lymphocytes Relative: 10 % — ABNORMAL LOW (ref 12–46)
Lymphs Abs: 0.5 10*3/uL — ABNORMAL LOW (ref 0.7–4.0)
MCH: 22.4 pg — ABNORMAL LOW (ref 26.0–34.0)
MCV: 68.2 fL — ABNORMAL LOW (ref 78.0–100.0)
Monocytes Absolute: 0.4 10*3/uL (ref 0.1–1.0)
RDW: 15.9 % — ABNORMAL HIGH (ref 11.5–15.5)
Rubella: 1.24 Index — ABNORMAL HIGH (ref <0.90)
WBC: 5.6 10*3/uL (ref 4.0–10.5)

## 2012-12-15 LAB — T-HELPER CELLS (CD4) COUNT (NOT AT ARMC): WBC, lymph enumeration: 5.6 10*3/uL (ref 4.0–10.5)

## 2012-12-15 MED ORDER — RALTEGRAVIR POTASSIUM 400 MG PO TABS: 400.0000 mg | ORAL_TABLET | Freq: Two times a day (BID) | ORAL | Status: DC

## 2012-12-15 NOTE — Progress Notes (Signed)
Subjective:    Patient ID: Catherine Martinez, female    DOB: 1986/06/15, 27 y.o.   MRN: [id number]  HPI  27 year old African American lady with HIV previously diagnosed with last pregnancy and taken care of by Dr Philipp Deputy --last seen in 2011. She lost medicaid post pregnancy and apparently did not know of the ADAP program and was lost to followup.  She became aware of her pregnancy in November of 2013 and apparently called the [facility] and per her account was told that they had no appts for several months. She received no prenatal care and was ultimately seen at [facility] for syncope on January 29th ue to  Volume depletion.  HER HIV was documented in OB notes BUT we in ID WERE NOT CALLED and patient was not started on ARV. She was treated for  trichomonas   SHe was scheduled  For Ob visit on the 24th and saw  Dr. Thad Ranger who  noted her prior HIV history and the fact that the pt had been out of care for several years and OFF ARV. She called me yesterday and I arranged for urgent ID clinic visit today. We were able to find samples of a medications for her and I proposed starting the pt on isentress twice daily and once daily truvada. WHile this is not teh FIRST choice of ARV in pregnancy this regimen will offer [place] a possibility of a MUCH MORE rapid drop in Viral load than a non-integrase containing regimen. Unfortunately the pt will still need a C seciton  To minimized risk of transmission to the infant.   We provided her with #60 isentress and # 30 truvada and she took first dose of each in clinic.   We spent greater than 60 minutes with the patient including greater than 50% of time in face to face counsel of the patient and in coordination of their care.    Review of Systems  Constitutional: Negative for fever, chills, diaphoresis, activity change, appetite change, fatigue and unexpected weight change.  HENT: Negative for congestion, sore throat, rhinorrhea, sneezing, trouble swallowing and sinus pressure.    Eyes: Negative for photophobia and visual disturbance.  Respiratory: Negative for cough, chest tightness, shortness of breath, wheezing and stridor.   Cardiovascular: Negative for chest pain, palpitations and leg swelling.  Gastrointestinal: Positive for abdominal distention. Negative for nausea, vomiting, abdominal pain, diarrhea, constipation, blood in stool and anal bleeding.  Genitourinary: Negative for dysuria, hematuria, flank pain and difficulty urinating.  Musculoskeletal: Negative for myalgias, back pain, joint swelling, arthralgias and gait problem.  Skin: Negative for color change, pallor, rash and wound.  Neurological: Negative for dizziness, tremors, weakness and light-headedness.  Hematological: Negative for adenopathy. Does not bruise/bleed easily.  Psychiatric/Behavioral: Negative for behavioral problems, confusion, sleep disturbance, dysphoric mood, decreased concentration and agitation.       Objective:   Physical Exam  Constitutional: She is oriented to person, place, and time. She appears well-developed and well-nourished. No distress.  HENT:  Head: Normocephalic and atraumatic.  Mouth/Throat: Oropharynx is clear and moist. No oropharyngeal exudate.  Eyes: Conjunctivae and EOM are normal. Pupils are equal, round, and reactive to light. No scleral icterus.  Neck: Normal range of motion. Neck supple. No JVD present.  Cardiovascular: Normal rate, regular rhythm and normal heart sounds.  Exam reveals no gallop and no friction rub.   No murmur heard. Pulmonary/Chest: Effort normal and breath sounds normal. No respiratory distress. She has no wheezes. She has no rales. She  exhibits no tenderness.  Abdominal: She exhibits no mass. There is no tenderness. There is no rebound and no guarding.  Musculoskeletal: She exhibits no edema and no tenderness.  Lymphadenopathy:    She has no cervical adenopathy.  Neurological: She is alert and oriented to person, place, and time. She  has normal reflexes. She exhibits normal muscle tone. Coordination normal.  Skin: Skin is warm and dry. She is not diaphoretic. No erythema. No pallor.  Psychiatric: She has a normal mood and affect. Her behavior is normal. Judgment and thought content normal.          Assessment & Plan:  HIV: will start isentress and truvada. This will ALSO be a more tolerable regimen than a PI based regimen. I will consider later changing her to [place] which will have similar tolerability and  Higher barrier to R  HIV and high risk pregnancy: emphasized how important it is to try to prevent transmission tot he baby. Agree with C section . New born will need post exp prophylaxis  Syncope: was worke d up as inpatient likely due to volume changes  Trichomonas: was treated during hospitalization

## 2012-12-16 ENCOUNTER — Telehealth: Payer: Self-pay | Admitting: General Practice

## 2012-12-16 ENCOUNTER — Other Ambulatory Visit: Payer: Self-pay | Admitting: Family Medicine

## 2012-12-16 LAB — HEMOGLOBINOPATHY EVALUATION: Hgb A2 Quant: 2.8 % (ref 2.2–3.2)

## 2012-12-16 MED ORDER — METRONIDAZOLE 500 MG PO TABS: 500.0000 mg | ORAL_TABLET | Freq: Two times a day (BID) | ORAL | Status: AC

## 2012-12-16 MED ORDER — FLUCONAZOLE 150 MG PO TABS: 150.0000 mg | ORAL_TABLET | Freq: Once | ORAL | Status: DC

## 2012-12-16 NOTE — Telephone Encounter (Signed)
Message copied by Kathee Delton on Wed Dec 16, 2012  8:32 AM ------      Message from: FERRY, [place]      Created: Wed Dec 16, 2012  7:30 AM       Needs treatment for BV and yeast. Rx sent. ------

## 2012-12-16 NOTE — Telephone Encounter (Signed)
Called patient and informed her of BV & yeast and that two medication were called in to her [facility] pharmacy. Patient verbalized understanding and had no further questions

## 2012-12-16 NOTE — Progress Notes (Unsigned)
30 day BTL Consent Form signed and scanned 12/14/12

## 2012-12-18 ENCOUNTER — Encounter (HOSPITAL_COMMUNITY): Payer: Self-pay | Admitting: Pharmacist

## 2012-12-21 ENCOUNTER — Other Ambulatory Visit: Payer: Self-pay | Admitting: Obstetrics and Gynecology

## 2012-12-21 ENCOUNTER — Encounter: Payer: Self-pay | Admitting: Obstetrics and Gynecology

## 2012-12-21 ENCOUNTER — Encounter: Payer: Self-pay | Admitting: Family Medicine

## 2012-12-21 ENCOUNTER — Ambulatory Visit (INDEPENDENT_AMBULATORY_CARE_PROVIDER_SITE_OTHER): Payer: Self-pay | Admitting: Obstetrics and Gynecology

## 2012-12-21 DIAGNOSIS — O0930 Supervision of pregnancy with insufficient antenatal care, unspecified trimester: Secondary | ICD-10-CM

## 2012-12-21 DIAGNOSIS — O09299 Supervision of pregnancy with other poor reproductive or obstetric history, unspecified trimester: Secondary | ICD-10-CM

## 2012-12-21 DIAGNOSIS — B20 Human immunodeficiency virus [HIV] disease: Secondary | ICD-10-CM

## 2012-12-21 DIAGNOSIS — O0933 Supervision of pregnancy with insufficient antenatal care, third trimester: Secondary | ICD-10-CM

## 2012-12-21 DIAGNOSIS — O98519 Other viral diseases complicating pregnancy, unspecified trimester: Secondary | ICD-10-CM

## 2012-12-21 DIAGNOSIS — O09293 Supervision of pregnancy with other poor reproductive or obstetric history, third trimester: Secondary | ICD-10-CM

## 2012-12-21 DIAGNOSIS — O98713 Human immunodeficiency virus [HIV] disease complicating pregnancy, third trimester: Secondary | ICD-10-CM

## 2012-12-21 DIAGNOSIS — O98719 Human immunodeficiency virus [HIV] disease complicating pregnancy, unspecified trimester: Secondary | ICD-10-CM | POA: Insufficient documentation

## 2012-12-21 LAB — POCT URINALYSIS DIP (DEVICE)
Bilirubin Urine: NEGATIVE
Glucose, UA: NEGATIVE mg/dL
Nitrite: NEGATIVE

## 2012-12-21 NOTE — Progress Notes (Signed)
HPI: Makira Holleman is a 27 y.o. female with hx of HIV who is pregnant and not on therapy. She is here to initiate therapy.   Allergies: Allergies  Allergen Reactions  . Sulfamethoxazole W-Trimethoprim     REACTION: rash    Vitals:    Past Medical History: Past Medical History  Diagnosis Date  . HIV (human immunodeficiency virus infection)   . History of stillbirth   . Headache   . Kidney infection     Recurrent    Social History: History   Social History  . Marital Status: Widowed    Spouse Name: N/A    Number of Children: N/A  . Years of Education: N/A   Social History Main Topics  . Smoking status: Former Smoker -- 1.00 packs/day    Types: Cigarettes  . Smokeless tobacco: Never Used  . Alcohol Use: No  . Drug Use: No  . Sexually Active: No   Other Topics Concern  . None   Social History Narrative  . None    Home Medications:  (Not in a hospital admission)  Current Regimen: Previous regimen: Combivir + Kaletra  Labs: HIV 1 RNA Quant (copies/mL)  Date Value  12/14/2012 4530*  05/25/2010 631*  05/11/2010 816*     CD4 T Cell Abs (cmm)  Date Value  05/11/2010 620   03/30/2010 410      Hep B S Ab (no units)  Date Value  03/30/2010 POS*     Hepatitis B Surface Ag (no units)  Date Value  12/14/2012 NEGATIVE      HCV Ab (no units)  Date Value  03/30/2010 NEG     CrCl: Estimated Creatinine Clearance: 103.3 ml/min (by C-G formula based on Cr of 0.5).  Lipids:    Component Value Date/Time   CHOL 228* 03/30/2010 2026   TRIG 177* 03/30/2010 2026   HDL 53 03/30/2010 2026   CHOLHDL 4.3 Ratio 03/30/2010 2026   VLDL 35 03/30/2010 2026   LDLCALC 140* 03/30/2010 2026    Assessment: 27 yo who is [redacted] wks pregnant and not on any therapy. She didn't seek care until very late into her pregnancy. The OB office did an emergency referral to start therapy. I discuss several options with her today. We have the option of starting ATV/r + truvada or RAL + truvada. We  elect to use raltegravir since it could potentially reduce viral quicker. She took the first dose in clinic. I filled out the pillbox for her.  Recommendations: RAL + Truvada  Clide Cliff, PharmD Clinical Infectious Disease Pharmacist [facility] 12/21/2012, 9:54 PM

## 2012-12-21 NOTE — Progress Notes (Signed)
Patient doing well without complaints. FM/PTL precautions reviewed. Patient scheduled for c-section with BTL on 3/10 secondary to elevated viral load

## 2012-12-22 ENCOUNTER — Ambulatory Visit: Payer: Self-pay | Admitting: Internal Medicine

## 2012-12-22 ENCOUNTER — Encounter: Payer: Self-pay | Admitting: *Deleted

## 2012-12-24 LAB — HIV-1 GENOTYPR PLUS

## 2012-12-25 ENCOUNTER — Inpatient Hospital Stay (HOSPITAL_COMMUNITY): Admission: RE | Admit: 2012-12-25 | Payer: Self-pay | Source: Ambulatory Visit

## 2012-12-28 ENCOUNTER — Encounter (HOSPITAL_COMMUNITY): Payer: Self-pay | Admitting: *Deleted

## 2012-12-28 ENCOUNTER — Encounter (HOSPITAL_COMMUNITY): Payer: Self-pay | Admitting: Anesthesiology

## 2012-12-28 ENCOUNTER — Encounter (HOSPITAL_COMMUNITY)
Admission: RE | Disposition: A | Discharge status: Home / Self Care | Payer: Self-pay | Source: Ambulatory Visit | Attending: Obstetrics & Gynecology

## 2012-12-28 ENCOUNTER — Inpatient Hospital Stay (HOSPITAL_COMMUNITY): Payer: Medicaid Other | Admitting: Anesthesiology

## 2012-12-28 ENCOUNTER — Inpatient Hospital Stay (HOSPITAL_COMMUNITY)
Admission: RE | Admit: 2012-12-28 | Discharge: 2012-12-30 | DRG: 765 | Disposition: A | Discharge status: Home / Self Care | Payer: Medicaid Other | Source: Ambulatory Visit | Attending: Obstetrics & Gynecology | Admitting: Obstetrics & Gynecology

## 2012-12-28 DIAGNOSIS — Z302 Encounter for sterilization: Secondary | ICD-10-CM

## 2012-12-28 DIAGNOSIS — O98519 Other viral diseases complicating pregnancy, unspecified trimester: Secondary | ICD-10-CM

## 2012-12-28 DIAGNOSIS — O0930 Supervision of pregnancy with insufficient antenatal care, unspecified trimester: Secondary | ICD-10-CM

## 2012-12-28 DIAGNOSIS — O09299 Supervision of pregnancy with other poor reproductive or obstetric history, unspecified trimester: Secondary | ICD-10-CM

## 2012-12-28 DIAGNOSIS — A599 Trichomoniasis, unspecified: Secondary | ICD-10-CM

## 2012-12-28 DIAGNOSIS — O34219 Maternal care for unspecified type scar from previous cesarean delivery: Secondary | ICD-10-CM

## 2012-12-28 DIAGNOSIS — O98713 Human immunodeficiency virus [HIV] disease complicating pregnancy, third trimester: Secondary | ICD-10-CM

## 2012-12-28 DIAGNOSIS — Z21 Asymptomatic human immunodeficiency virus [HIV] infection status: Secondary | ICD-10-CM

## 2012-12-28 HISTORY — DX: Anemia, unspecified: D64.9

## 2012-12-28 LAB — CBC
HCT: 28.6 % — ABNORMAL LOW (ref 36.0–46.0)
Hemoglobin: 9.1 g/dL — ABNORMAL LOW (ref 12.0–15.0)
MCH: 22.5 pg — ABNORMAL LOW (ref 26.0–34.0)
MCHC: 31.8 g/dL (ref 30.0–36.0)
MCV: 70.8 fL — ABNORMAL LOW (ref 78.0–100.0)
Platelets: 253 K/uL (ref 150–400)
RBC: 4.04 MIL/uL (ref 3.87–5.11)
RDW: 14.7 % (ref 11.5–15.5)
WBC: 4 K/uL (ref 4.0–10.5)

## 2012-12-28 LAB — TYPE AND SCREEN
ABO/RH(D): O POS
Antibody Screen: NEGATIVE

## 2012-12-28 SURGERY — Surgical Case
Anesthesia: Spinal | Site: Abdomen | Wound class: Clean Contaminated

## 2012-12-28 MED ORDER — ONDANSETRON HCL 4 MG/2ML IJ SOLN: 4.0000 mg | Freq: Three times a day (TID) | INTRAMUSCULAR | Status: DC | PRN

## 2012-12-28 MED ORDER — NALBUPHINE HCL 10 MG/ML IJ SOLN
5.0000 mg | INTRAMUSCULAR | Status: DC | PRN
  Filled 2012-12-28 (×2): qty 1

## 2012-12-28 MED ORDER — OXYTOCIN 10 UNIT/ML IJ SOLN
INTRAMUSCULAR | Status: AC
  Filled 2012-12-28: qty 3

## 2012-12-28 MED ORDER — LANOLIN HYDROUS EX OINT: 1.0000 "application " | TOPICAL_OINTMENT | CUTANEOUS | Status: DC | PRN

## 2012-12-28 MED ORDER — LACTATED RINGERS IV SOLN
INTRAVENOUS | Status: DC
  Administered 2012-12-28: 14:00:00 via INTRAVENOUS

## 2012-12-28 MED ORDER — NALOXONE HCL 0.4 MG/ML IJ SOLN: 0.4000 mg | INTRAMUSCULAR | Status: DC | PRN

## 2012-12-28 MED ORDER — ONDANSETRON HCL 4 MG/2ML IJ SOLN: 4.0000 mg | INTRAMUSCULAR | Status: DC | PRN

## 2012-12-28 MED ORDER — ZOLPIDEM TARTRATE 5 MG PO TABS: 5.0000 mg | ORAL_TABLET | Freq: Every evening | ORAL | Status: DC | PRN

## 2012-12-28 MED ORDER — DIPHENHYDRAMINE HCL 50 MG/ML IJ SOLN: 25.0000 mg | INTRAMUSCULAR | Status: DC | PRN

## 2012-12-28 MED ORDER — OXYCODONE-ACETAMINOPHEN 5-325 MG PO TABS
1.0000 | ORAL_TABLET | ORAL | Status: DC | PRN
  Administered 2012-12-30 (×2): 1 via ORAL
  Filled 2012-12-28 (×2): qty 1

## 2012-12-28 MED ORDER — SCOPOLAMINE 1 MG/3DAYS TD PT72: 1.0000 | MEDICATED_PATCH | Freq: Once | TRANSDERMAL | Status: DC

## 2012-12-28 MED ORDER — MORPHINE SULFATE (PF) 0.5 MG/ML IJ SOLN
INTRAMUSCULAR | Status: DC | PRN
  Administered 2012-12-28: .15 mg via INTRATHECAL

## 2012-12-28 MED ORDER — MORPHINE SULFATE 0.5 MG/ML IJ SOLN
INTRAMUSCULAR | Status: AC
  Filled 2012-12-28: qty 10

## 2012-12-28 MED ORDER — MEPERIDINE HCL 25 MG/ML IJ SOLN: 6.2500 mg | INTRAMUSCULAR | Status: DC | PRN

## 2012-12-28 MED ORDER — CEFAZOLIN SODIUM-DEXTROSE 2-3 GM-% IV SOLR
2.0000 g | INTRAVENOUS | Status: AC
  Administered 2012-12-28: 2 g via INTRAVENOUS

## 2012-12-28 MED ORDER — DIBUCAINE 1 % RE OINT: 1.0000 "application " | TOPICAL_OINTMENT | RECTAL | Status: DC | PRN

## 2012-12-28 MED ORDER — ONDANSETRON HCL 4 MG PO TABS: 4.0000 mg | ORAL_TABLET | ORAL | Status: DC | PRN

## 2012-12-28 MED ORDER — NALBUPHINE HCL 10 MG/ML IJ SOLN: 5.0000 mg | INTRAMUSCULAR | Status: DC | PRN

## 2012-12-28 MED ORDER — SIMETHICONE 80 MG PO CHEW
80.0000 mg | CHEWABLE_TABLET | Freq: Three times a day (TID) | ORAL | Status: DC
  Administered 2012-12-28 – 2012-12-29 (×5): 80 mg via ORAL

## 2012-12-28 MED ORDER — IBUPROFEN 600 MG PO TABS: 600.0000 mg | ORAL_TABLET | Freq: Four times a day (QID) | ORAL | Status: DC | PRN

## 2012-12-28 MED ORDER — EPHEDRINE SULFATE 50 MG/ML IJ SOLN
INTRAMUSCULAR | Status: DC | PRN
  Administered 2012-12-28 (×3): 10 mg via INTRAVENOUS

## 2012-12-28 MED ORDER — IBUPROFEN 600 MG PO TABS
600.0000 mg | ORAL_TABLET | Freq: Four times a day (QID) | ORAL | Status: DC
  Administered 2012-12-29 – 2012-12-30 (×6): 600 mg via ORAL
  Filled 2012-12-28 (×6): qty 1

## 2012-12-28 MED ORDER — BUPIVACAINE HCL (PF) 0.5 % IJ SOLN
INTRAMUSCULAR | Status: AC
  Filled 2012-12-28: qty 30

## 2012-12-28 MED ORDER — PHENYLEPHRINE 40 MCG/ML (10ML) SYRINGE FOR IV PUSH (FOR BLOOD PRESSURE SUPPORT)
PREFILLED_SYRINGE | INTRAVENOUS | Status: AC
  Filled 2012-12-28: qty 5

## 2012-12-28 MED ORDER — DIPHENHYDRAMINE HCL 25 MG PO CAPS
25.0000 mg | ORAL_CAPSULE | ORAL | Status: DC | PRN
  Filled 2012-12-28: qty 1

## 2012-12-28 MED ORDER — LACTATED RINGERS IV SOLN
Freq: Once | INTRAVENOUS | Status: AC
  Administered 2012-12-28: 11:00:00 via INTRAVENOUS

## 2012-12-28 MED ORDER — ONDANSETRON HCL 4 MG/2ML IJ SOLN
INTRAMUSCULAR | Status: AC
  Filled 2012-12-28: qty 2

## 2012-12-28 MED ORDER — BUPIVACAINE IN DEXTROSE 0.75-8.25 % IT SOLN
INTRATHECAL | Status: DC | PRN
  Administered 2012-12-28: 1.4 mL via INTRATHECAL

## 2012-12-28 MED ORDER — METOCLOPRAMIDE HCL 5 MG/ML IJ SOLN
10.0000 mg | Freq: Three times a day (TID) | INTRAMUSCULAR | Status: DC | PRN
  Administered 2012-12-28: 10 mg via INTRAVENOUS
  Filled 2012-12-28: qty 2

## 2012-12-28 MED ORDER — ONDANSETRON HCL 4 MG/2ML IJ SOLN
INTRAMUSCULAR | Status: DC | PRN
  Administered 2012-12-28: 4 mg via INTRAVENOUS

## 2012-12-28 MED ORDER — FENTANYL CITRATE 0.05 MG/ML IJ SOLN
INTRAMUSCULAR | Status: AC
  Filled 2012-12-28: qty 2

## 2012-12-28 MED ORDER — FENTANYL CITRATE 0.05 MG/ML IJ SOLN: 25.0000 ug | INTRAMUSCULAR | Status: DC | PRN

## 2012-12-28 MED ORDER — INFLUENZA VIRUS VACC SPLIT PF IM SUSP
0.5000 mL | INTRAMUSCULAR | Status: AC
  Administered 2012-12-29: 0.5 mL via INTRAMUSCULAR
  Filled 2012-12-28: qty 0.5

## 2012-12-28 MED ORDER — 0.9 % SODIUM CHLORIDE (POUR BTL) OPTIME
TOPICAL | Status: DC | PRN
  Administered 2012-12-28 (×2): 1000 mL

## 2012-12-28 MED ORDER — EMTRICITABINE-TENOFOVIR DF 200-300 MG PO TABS
1.0000 | ORAL_TABLET | Freq: Every day | ORAL | Status: DC
  Administered 2012-12-29 – 2012-12-30 (×2): 1 via ORAL
  Filled 2012-12-28 (×2): qty 1

## 2012-12-28 MED ORDER — SODIUM CHLORIDE 0.9 % IJ SOLN: 3.0000 mL | INTRAMUSCULAR | Status: DC | PRN

## 2012-12-28 MED ORDER — NALOXONE HCL 1 MG/ML IJ SOLN: 1.0000 ug/kg/h | INTRAVENOUS | Status: DC | PRN

## 2012-12-28 MED ORDER — OXYTOCIN 40 UNITS IN LACTATED RINGERS INFUSION - SIMPLE MED: 62.5000 mL/h | INTRAVENOUS | Status: AC

## 2012-12-28 MED ORDER — SIMETHICONE 80 MG PO CHEW: 80.0000 mg | CHEWABLE_TABLET | ORAL | Status: DC | PRN

## 2012-12-28 MED ORDER — KETOROLAC TROMETHAMINE 30 MG/ML IJ SOLN: 30.0000 mg | Freq: Four times a day (QID) | INTRAMUSCULAR | Status: AC | PRN

## 2012-12-28 MED ORDER — ZIDOVUDINE 10 MG/ML IV SOLN
1.0000 mg/kg/h | INTRAVENOUS | Status: DC
  Administered 2012-12-28: 1 mg/kg/h via INTRAVENOUS
  Filled 2012-12-28: qty 40

## 2012-12-28 MED ORDER — SENNOSIDES-DOCUSATE SODIUM 8.6-50 MG PO TABS
2.0000 | ORAL_TABLET | Freq: Every day | ORAL | Status: DC
  Administered 2012-12-28 – 2012-12-29 (×2): 2 via ORAL

## 2012-12-28 MED ORDER — KETOROLAC TROMETHAMINE 30 MG/ML IJ SOLN
30.0000 mg | Freq: Four times a day (QID) | INTRAMUSCULAR | Status: AC | PRN
  Administered 2012-12-29: 30 mg via INTRAVENOUS
  Filled 2012-12-28: qty 1

## 2012-12-28 MED ORDER — FENTANYL CITRATE 0.05 MG/ML IJ SOLN
INTRAMUSCULAR | Status: DC | PRN
  Administered 2012-12-28: 25 ug via INTRATHECAL

## 2012-12-28 MED ORDER — BUPIVACAINE HCL (PF) 0.5 % IJ SOLN
INTRAMUSCULAR | Status: DC | PRN
  Administered 2012-12-28: 30 mL

## 2012-12-28 MED ORDER — SCOPOLAMINE 1 MG/3DAYS TD PT72
MEDICATED_PATCH | TRANSDERMAL | Status: AC
  Administered 2012-12-28: 1.5 mg via TRANSDERMAL
  Filled 2012-12-28: qty 1

## 2012-12-28 MED ORDER — DIPHENHYDRAMINE HCL 50 MG/ML IJ SOLN
12.5000 mg | INTRAMUSCULAR | Status: DC | PRN
  Administered 2012-12-28: 12.5 mg via INTRAVENOUS
  Filled 2012-12-28: qty 1

## 2012-12-28 MED ORDER — CEFAZOLIN SODIUM-DEXTROSE 2-3 GM-% IV SOLR
INTRAVENOUS | Status: AC
  Filled 2012-12-28: qty 50

## 2012-12-28 MED ORDER — WITCH HAZEL-GLYCERIN EX PADS: 1.0000 "application " | MEDICATED_PAD | CUTANEOUS | Status: DC | PRN

## 2012-12-28 MED ORDER — DIPHENHYDRAMINE HCL 25 MG PO CAPS: 25.0000 mg | ORAL_CAPSULE | Freq: Four times a day (QID) | ORAL | Status: DC | PRN

## 2012-12-28 MED ORDER — PHENYLEPHRINE HCL 10 MG/ML IJ SOLN
INTRAMUSCULAR | Status: DC | PRN
  Administered 2012-12-28: 80 ug via INTRAVENOUS

## 2012-12-28 MED ORDER — PRENATAL MULTIVITAMIN CH
1.0000 | ORAL_TABLET | Freq: Every day | ORAL | Status: DC
  Administered 2012-12-29 – 2012-12-30 (×2): 1 via ORAL
  Filled 2012-12-28 (×2): qty 1

## 2012-12-28 MED ORDER — LACTATED RINGERS IV SOLN
INTRAVENOUS | Status: DC
  Administered 2012-12-28 – 2012-12-29 (×2): via INTRAVENOUS

## 2012-12-28 MED ORDER — MENTHOL 3 MG MT LOZG: 1.0000 | LOZENGE | OROMUCOSAL | Status: DC | PRN

## 2012-12-28 MED ORDER — OXYTOCIN 10 UNIT/ML IJ SOLN
40.0000 [IU] | INTRAVENOUS | Status: DC | PRN
  Administered 2012-12-28: 40 [IU] via INTRAVENOUS

## 2012-12-28 MED ORDER — LACTATED RINGERS IV SOLN
INTRAVENOUS | Status: DC | PRN
  Administered 2012-12-28: 16:00:00 via INTRAVENOUS

## 2012-12-28 MED ORDER — EPHEDRINE 5 MG/ML INJ
INTRAVENOUS | Status: AC
  Filled 2012-12-28: qty 10

## 2012-12-28 MED ORDER — ZIDOVUDINE 10 MG/ML IV SOLN
2.0000 mg/kg | Freq: Once | INTRAVENOUS | Status: AC
  Administered 2012-12-28: 149 mg via INTRAVENOUS
  Filled 2012-12-28: qty 14.9

## 2012-12-28 MED ORDER — TETANUS-DIPHTH-ACELL PERTUSSIS 5-2.5-18.5 LF-MCG/0.5 IM SUSP
0.5000 mL | Freq: Once | INTRAMUSCULAR | Status: AC
  Administered 2012-12-30: 0.5 mL via INTRAMUSCULAR
  Filled 2012-12-28: qty 0.5

## 2012-12-28 MED ORDER — RALTEGRAVIR POTASSIUM 400 MG PO TABS
400.0000 mg | ORAL_TABLET | Freq: Two times a day (BID) | ORAL | Status: DC
  Administered 2012-12-28 – 2012-12-30 (×4): 400 mg via ORAL
  Filled 2012-12-28 (×4): qty 1

## 2012-12-28 SURGICAL SUPPLY — 39 items
BARRIER ADHS 3X4 INTERCEED (GAUZE/BANDAGES/DRESSINGS) IMPLANT
CLIP FILSHIE TUBAL LIGA STRL (Clip) ×4 IMPLANT
CLOTH BEACON ORANGE TIMEOUT ST (SAFETY) ×2 IMPLANT
CONTAINER PREFILL 10% NBF 15ML (MISCELLANEOUS) IMPLANT
DRAPE LG THREE QUARTER DISP (DRAPES) ×2 IMPLANT
DRESSING TELFA 8X3 (GAUZE/BANDAGES/DRESSINGS) ×2 IMPLANT
DRSG OPSITE POSTOP 4X10 (GAUZE/BANDAGES/DRESSINGS) ×4 IMPLANT
DURAPREP 26ML APPLICATOR (WOUND CARE) ×2 IMPLANT
ELECT REM PT RETURN 9FT ADLT (ELECTROSURGICAL) ×2
ELECTRODE REM PT RTRN 9FT ADLT (ELECTROSURGICAL) ×1 IMPLANT
GAUZE SPONGE 4X4 12PLY STRL LF (GAUZE/BANDAGES/DRESSINGS) ×2 IMPLANT
GLOVE BIO SURGEON STRL SZ 6.5 (GLOVE) ×2 IMPLANT
GOWN STRL REIN XL XLG (GOWN DISPOSABLE) ×4 IMPLANT
KIT ABG SYR 3ML LUER SLIP (SYRINGE) IMPLANT
NEEDLE HYPO 25X5/8 SAFETYGLIDE (NEEDLE) IMPLANT
NEEDLE SPNL 18GX3.5 QUINCKE PK (NEEDLE) ×2 IMPLANT
NS IRRIG 1000ML POUR BTL (IV SOLUTION) ×2 IMPLANT
PACK C SECTION WH (CUSTOM PROCEDURE TRAY) ×2 IMPLANT
PAD ABD 7.5X8 STRL (GAUZE/BANDAGES/DRESSINGS) ×6 IMPLANT
PAD OB MATERNITY 4.3X12.25 (PERSONAL CARE ITEMS) ×2 IMPLANT
SLEEVE SCD COMPRESS KNEE MED (MISCELLANEOUS) IMPLANT
SPONGE SURGIFOAM ABS GEL 12-7 (HEMOSTASIS) ×2 IMPLANT
STRIP CLOSURE SKIN 1/2X4 (GAUZE/BANDAGES/DRESSINGS) ×4 IMPLANT
SUT PDS AB 0 CTX 60 (SUTURE) IMPLANT
SUT VIC AB 0 CT1 27 (SUTURE) ×3
SUT VIC AB 0 CT1 27XBRD ANBCTR (SUTURE) ×3 IMPLANT
SUT VIC AB 0 CT1 36 (SUTURE) IMPLANT
SUT VIC AB 2-0 CT1 27 (SUTURE) ×1
SUT VIC AB 2-0 CT1 TAPERPNT 27 (SUTURE) ×1 IMPLANT
SUT VIC AB 2-0 CTX 36 (SUTURE) ×4 IMPLANT
SUT VIC AB 3-0 CT1 27 (SUTURE) ×1
SUT VIC AB 3-0 CT1 TAPERPNT 27 (SUTURE) ×1 IMPLANT
SUT VIC AB 3-0 SH 27 (SUTURE)
SUT VIC AB 3-0 SH 27X BRD (SUTURE) IMPLANT
SYR 30ML LL (SYRINGE) ×2 IMPLANT
TAPE CLOTH SURG 4X10 WHT LF (GAUZE/BANDAGES/DRESSINGS) ×2 IMPLANT
TOWEL OR 17X24 6PK STRL BLUE (TOWEL DISPOSABLE) ×6 IMPLANT
TRAY FOLEY CATH 14FR (SET/KITS/TRAYS/PACK) ×2 IMPLANT
WATER STERILE IRR 1000ML POUR (IV SOLUTION) ×2 IMPLANT

## 2012-12-28 NOTE — Anesthesia Procedure Notes (Signed)
Spinal  Patient location during procedure: OR Start time: 12/28/2012 4:05 PM Staffing Anesthesiologist: FOSTER, MICHAEL A. Performed by: anesthesiologist  Preanesthetic Checklist Completed: patient identified, site marked, surgical consent, pre-op evaluation, timeout performed, IV checked, risks and benefits discussed and monitors and equipment checked Spinal Block Patient position: sitting Prep: site prepped and draped and DuraPrep Patient monitoring: heart rate, cardiac monitor, continuous pulse ox and blood pressure Approach: midline Location: L3-4 Injection technique: single-shot Needle Needle type: Sprotte  Needle gauge: 24 G Needle length: 9 cm Needle insertion depth: 5 cm Assessment Sensory level: T4 Additional Notes Patient tolerated procedure well. Adequate sensory level.

## 2012-12-28 NOTE — Op Note (Signed)
12/28/2012  4:49 PM  PATIENT:  Catherine Martinez  27 y.o. female  PRE-OPERATIVE DIAGNOSIS:  Patient will need 4 hours AZT prior to section.  Untreated [id number], [redacted] weeks EGA, desires permanent sterility  POST-OPERATIVE DIAGNOSIS:  same  PROCEDURE:  Procedure(s) with comments: CESAREAN SECTION WITH BILATERAL TUBAL LIGATION (N/A) - AZT prior to section  SURGEON:  Surgeon(s) and Role:    * [name], MD - Primary  PHYSICIAN ASSISTANT:   ASSISTANTS: Lorna Dibble, PA-S   FINDINGS: Living female infant with Apgars of 7 & 8, normal intact placenta with 3 vessel cord, normal adnexa  ANESTHESIA:   spinal  EBL:  Total I/O In: 2000 [I.V.:2000] Out: 1000 [Urine:300; Blood:700]  BLOOD ADMINISTERED:none  DRAINS: none   LOCAL MEDICATIONS USED:  MARCAINE     SPECIMEN:  Source of Specimen:  cord blood  DISPOSITION OF SPECIMEN:  PATHOLOGY  COUNTS:  YES  TOURNIQUET:  * No tourniquets in log *  DICTATION: .Dragon Dictation  PLAN OF CARE: Admit to inpatient   PATIENT DISPOSITION:  PACU - hemodynamically stable.   Delay start of Pharmacological VTE agent (>24hrs) due to surgical blood loss or risk of bleeding: yes  The risks, benefits, and alternatives of surgery were explained, understood, accepted. Consents were signed. All questions were answered. In the operating room spinal anesthesia was applied without complication. Her abdomen and vagina were prepped and draped in the usual sterile fashion. A Foley catheter was placed, and it drained clear urine throughout the case. Timeout procedure was done. After adequate anesthesia was assured 30 mL for 0.5% Marcaine was injected into the subcutaneous tissue approximately 2 cm above the symphysis pubis. An incision was made here. The incision was carried down through the subcutaneous tissue to the fascia. The fascia was scored the midline and extended bilaterally. Bleeding encountered was cauterized with the Bovie. The middle 20% of the rectus  muscles were separated in a transverse fashion using electrosurgical technique. Excellent hemostasis was maintained. The peritoneum was entered with hemostats. Peritoneal incision was extended bilaterally with the Bovie. The bladder blade was placed. A transverse incision was made on the well-developed lower uterine segment. The uterine incision was extended with bandage scissors on each side. Amniotomy was performed with a hemostat. Clear fluid was noted. The baby was delivered from a vertex presentation. The mouth and nostrils were suctioned prior to delivery of the shoulders. The baby's cord was clamped and cut and it was transferred to the NICU personnel for routine care. The placenta was delivered intact with traction. The uterus was left in situ, and the interior was cleaned with a dry lap sponge. The uterine incision was closed with 1 layer of 2-0 Vicryl running locking suture. Excellent hemostasis was noted. By tilting the uterus each side was able to visualize the adnexa, and they were normal. I placed a Filsche clip across the entire right oviduct in the isthmic region. I then did the same thing on the left oviduct. Hemostasis was noted. The rectus fascia and rectus muscles were noted be hemostatic as well. The fascia was closed with a 0 vicryl suture in a running nonlocking fashion. No defects were palpable. The subcutaneous tissue was irrigated, clean, and dried. A subcuticular closure was done with a 3-0 Vicryl suture. Steri-Strips are placed. Excellent cosmetic results were obtained. She was taken to the recovery room in stable condition. She tolerated the procedure well.

## 2012-12-28 NOTE — Anesthesia Postprocedure Evaluation (Signed)
  Anesthesia Post-op Note  Patient: Catherine Martinez  Procedure(s) Performed: Procedure(s) with comments: CESAREAN SECTION WITH BILATERAL TUBAL LIGATION (N/A) - AZT prior to section  Patient Location: PACU  Anesthesia Type:Spinal  Level of Consciousness: awake, alert  and oriented  Airway and Oxygen Therapy: Patient Spontanous Breathing  Post-op Pain: none  Post-op Assessment: Post-op Vital signs reviewed, Patient's Cardiovascular Status Stable, Respiratory Function Stable, Patent Airway, No signs of Nausea or vomiting, Pain level controlled, No headache, No backache, No residual numbness and No residual motor weakness  Post-op Vital Signs: Reviewed and stable  Complications: No apparent anesthesia complications

## 2012-12-28 NOTE — Transfer of Care (Signed)
Immediate Anesthesia Transfer of Care Note  Patient: Catherine Martinez  Procedure(s) Performed: Procedure(s) with comments: CESAREAN SECTION WITH BILATERAL TUBAL LIGATION (N/A) - AZT prior to section  Patient Location: PACU  Anesthesia Type:Spinal  Level of Consciousness: awake, alert  and oriented  Airway & Oxygen Therapy: Patient Spontanous Breathing  Post-op Assessment: Report given to PACU RN and Post -op Vital signs reviewed and stable  Post vital signs: Reviewed and stable  Complications: No apparent anesthesia complications

## 2012-12-28 NOTE — Anesthesia Preprocedure Evaluation (Signed)
Anesthesia Evaluation  Patient identified by MRN, date of birth, ID band Patient awake    Reviewed: Allergy & Precautions, H&P , NPO status , Patient's Chart, lab work & pertinent test results  Airway Mallampati: II TM Distance: >3 FB Neck ROM: Full    Dental no notable dental hx. (+) Teeth Intact   Pulmonary former smoker,  breath sounds clear to auscultation  Pulmonary exam normal       Cardiovascular negative cardio ROS  Rhythm:Regular Rate:Normal     Neuro/Psych  Headaches, negative neurological ROS  negative psych ROS   GI/Hepatic negative GI ROS, Neg liver ROS,   Endo/Other  negative endocrine ROS  Renal/GU Renal diseasenegative Renal ROS  negative genitourinary   Musculoskeletal   Abdominal Normal abdominal exam  (+)   Peds  Hematology  (+) anemia , HIV positive   Anesthesia Other Findings   Reproductive/Obstetrics (+) Pregnancy                           Anesthesia Physical Anesthesia Plan  ASA: II  Anesthesia Plan: Spinal   Post-op Pain Management:    Induction:   Airway Management Planned:   Additional Equipment:   Intra-op Plan:   Post-operative Plan:   Informed Consent: I have reviewed the patients History and Physical, chart, labs and discussed the procedure including the risks, benefits and alternatives for the proposed anesthesia with the patient or authorized representative who has indicated his/her understanding and acceptance.   Dental Advisory Given  Plan Discussed with: Anesthesiologist, CRNA and Surgeon  Anesthesia Plan Comments:         Anesthesia Quick Evaluation

## 2012-12-28 NOTE — H&P (Addendum)
Catherine Martinez is a 27 y.o. female presenting at [redacted] weeks EGA for scheduled PLTCS and PPS due to her HIV infection. She wants permanent sterility. Her most recent viral load is such that her ID MD (at [facility]) recommends a PLTCS. Her pregnancy was complicated by very late prenatal care and a gap of at least 6 months in taking her antiviral medications.  History OB History   Grav Para Term Preterm Abortions TAB SAB Ect Mult Living   5 4 4       3      Past Medical History  Diagnosis Date  . HIV (human immunodeficiency virus infection)   . History of stillbirth   . Headache   . Kidney infection     Recurrent   Past Surgical History  Procedure Laterality Date  . No past surgeries     Family History: family history includes Diabetes in her maternal aunt, maternal grandfather, and mother and Hyperlipidemia in her maternal grandfather.  There is no history of Other. Social History:  reports that she has quit smoking. Her smoking use included Cigarettes. She smoked 1.00 pack per day. She has never used smokeless tobacco. She reports that she does not drink alcohol or use illicit drugs.   Prenatal Transfer Tool  Maternal Diabetes: No Genetic Screening: not done Maternal Ultrasounds/Referrals: Declined Fetal Ultrasounds or other Referrals:  None Maternal Substance Abuse:  No Significant Maternal Medications:  Meds include: Other:  truvada and isentris Significant Maternal Lab Results:  None Other Comments:  None  ROS    There were no vitals taken for this visit. Exam Physical Exam  Prenatal labs: ABO, Rh: O/POS/-- (02/24 1129) Antibody: NEG (02/24 1129) Rubella: 1.24 (02/24 1129) RPR: NON REAC (02/24 1129)  HBsAg: NEGATIVE (02/24 1129)  HIV:    GBS:     Assessment/Plan: 39 weeks with HIV and viral load that would lead to recommendation for PLTCS.  Desire for sterility. Plan for PPS. We have discussed the failure rate of this surgery.   Carlon Chaloux C. 12/28/2012,  10:18 AM

## 2012-12-29 ENCOUNTER — Encounter (HOSPITAL_COMMUNITY): Payer: Self-pay | Admitting: Obstetrics & Gynecology

## 2012-12-29 LAB — CBC
HCT: 24.4 % — ABNORMAL LOW (ref 36.0–46.0)
Hemoglobin: 7.6 g/dL — ABNORMAL LOW (ref 12.0–15.0)
RBC: 3.44 MIL/uL — ABNORMAL LOW (ref 3.87–5.11)
RDW: 14.7 % (ref 11.5–15.5)
WBC: 5.1 10*3/uL (ref 4.0–10.5)

## 2012-12-29 NOTE — Clinical Social Work Maternal (Signed)
Clinical Social Work Department PSYCHOSOCIAL ASSESSMENT - MATERNAL/CHILD 12/29/2012  Patient:  KIERSTYNN, BABICH  Account Number:  [id number]  Admit Date:  12/28/2012  Marjo Bicker Name:   Phebe Colla    Clinical Social Worker:  Nobie Putnam, LCSW   Date/Time:  12/29/2012 02:50 PM  Date Referred:  12/29/2012   Referral source  CN     Referred reason  [facility]  O42   Other referral source:    I:  FAMILY / HOME ENVIRONMENT Child's legal guardian:  PARENT  Guardian - Name Guardian - Age Guardian - Address  Ayriel Texidor [address] [place] [address]  Atilano Ina 24 (same as above)   Other household support members/support persons Name Relationship DOB  [place], Montez Hageman. BROTHER 37 years old   SON 2007   DAUGHTER 2008   SON 2011   Other support:   Sister  Renato Gails    II  PSYCHOSOCIAL DATA Information Source:  Patient Interview  Surveyor, quantity and Community Resources Employment:   Systems developer resources:  Self Pay If [employer]:   Clinical biochemist  WIC   School / Grade:   Maternity Care Coordinator / Child Services Coordination / Early Interventions:  Cultural issues impacting care:    III  STRENGTHS Strengths  Adequate Resources  Home prepared for Child (including basic supplies)  Supportive family/friends   Strength comment:    IV  RISK FACTORS AND CURRENT PROBLEMS Current Problem:  YES   Risk Factor & Current Problem Patient Issue Family Issue Risk Factor / Current Problem Comment  Other - See comment [facility] @ 37 weeks  Other - See comment Y N o42    V  SOCIAL WORK ASSESSMENT CSW referral received to assess pt's reason for [facility] @ 37 weeks & schedule a follow up appointment at [facility] ID clinic for infant.  Pt told CSW that she did not know that she was pregnant until 11/13, after she started to feel movement. Pt was seen in January '14 in [facility], after she passed out at work.  During that visit, pt was given an appointment  for the [facility] clinic.  Pt applied for Medicaid once she learned of pregnancy.  Pt told CSW that she tried to schedule an appointment at [facility] (to receive pregnancy confirmation) but she wasn't scheduled an appointment until March '14.  She denies illegal substance use & verbalized understanding of hospital drug testing policy.  UDS is negative, meconium results are pending.  CSW spoke with pt about o42 diagnoses & her ability to cope with illness.  Pt denies any depression. She told CSW that she has talked to her pastor & continues to reach out to him when needed.  Pt also told CSW that she talks to her sister, who is also aware of the diagnoses. She is not interested in any counseling or taking an anti-depressant.  She denies any SI history.  According to pt, FOB is aware of diagnoses but does not like to talk or hear about it.  He was not present during the assessment. Pt was taking her medication regularly until 8/13, when she loss insurance.  She explained that she was unaware of the AIDS Drug Assistance Program, (ADAP) until she was seen in [facility]U in January.  She was referred to the program & restarted medication in February 2014.  Pt did not take o42 medications for about 6 months. She is familiar with required follow up care for  the infant, as her youngest son went through the same process. CSW scheduled the appointment for 01/13/13 @ 11:30.  CSW will provide pt with appointment time/information.  She has reliable transportation & understands the importance of taking the infant to appointments.  Pt has all the necessary supplies for the baby.  CSW will continue to monitor drug screen results & make a referral if needed.      VI SOCIAL WORK PLAN Social Work Plan  No Further Intervention Required / No Barriers to Discharge   Type of pt/family education:   If child protective services report - county:   If child protective services report - date:   Information/referral to community resources  comment:   [facility] Clinic   Other social work plan:

## 2012-12-29 NOTE — Anesthesia Postprocedure Evaluation (Signed)
  Anesthesia Post-op Note  Patient: Catherine Martinez  Procedure(s) Performed: Procedure(s) with comments: CESAREAN SECTION WITH BILATERAL TUBAL LIGATION (N/A) - AZT prior to section  Patient Location: Mother/Baby  Anesthesia Type:Spinal  Level of Consciousness: awake, alert  and oriented  Airway and Oxygen Therapy: Patient Spontanous Breathing  Post-op Pain: mild  Post-op Assessment: Patient's Cardiovascular Status Stable and Respiratory Function Stable  Post-op Vital Signs: stable  Complications: No apparent anesthesia complications

## 2012-12-29 NOTE — Progress Notes (Signed)
UR completed 

## 2012-12-29 NOTE — Progress Notes (Signed)
Subjective: Postpartum Day 1: Cesarean Delivery/PPS Patient reports tolerating PO.    Objective: Vital signs in last 24 hours: Temp:  [97.4 F (36.3 C)-98.2 F (36.8 C)] 97.7 F (36.5 C) (03/11 0355) Pulse Rate:  [66-79] 70 (03/11 0355) Resp:  [16-20] 16 (03/11 0355) BP: (104-133)/(66-85) 118/70 mmHg (03/11 0355) SpO2:  [100 %] 100 % (03/11 0355) Weight:  [74.39 kg (164 lb)-74.844 kg (165 lb)] 74.844 kg (165 lb) (03/10 1037)  Physical Exam:  General: alert Lochia: appropriate Uterine Fundus: firm Incision: c/d/i honeycomb dressing (pressure dressing removed) DVT Evaluation: No evidence of DVT seen on physical exam.   Recent Labs  12/28/12 1005 12/29/12 0625  HGB 9.1* 7.6*  HCT 28.6* 24.4*    Assessment/Plan: Status post Cesarean section. Doing well postoperatively.  Continue current care.  DOVE,MYRA C. 12/29/2012, 7:20 AM

## 2012-12-30 MED ORDER — OXYCODONE-ACETAMINOPHEN 5-325 MG PO TABS: 1.0000 | ORAL_TABLET | ORAL | Status: DC | PRN

## 2012-12-30 MED ORDER — IBUPROFEN 200 MG PO TABS: 200.0000 mg | ORAL_TABLET | Freq: Four times a day (QID) | ORAL | Status: DC | PRN

## 2012-12-30 NOTE — Discharge Summary (Signed)
Obstetric Discharge Summary Reason for Admission: cesarean section Prenatal Procedures: NST Intrapartum Procedures: cesarean: low cervical, transverse and HIV treatment Postpartum Procedures: P.P. tubal ligation Complications-Operative and Postpartum: none Hemoglobin  Date Value Range Status  12/29/2012 7.6* 12.0 - 15.0 g/dL Final     HCT  Date Value Range Status  12/29/2012 24.4* 36.0 - 46.0 % Final    Physical Exam:  General: alert, cooperative and appears stated age Cardiac: RRR, no m/g/r Lungs: CLAB, no w/r/r/ Lochia: appropriate Uterine Fundus: firm Incision: healing well, no significant drainage, no dehiscence, no significant erythema DVT Evaluation: No evidence of DVT seen on physical exam. No cords or calf tenderness.  Discharge Diagnoses: Term Pregnancy-delivered  Discharge Information: Date: 12/30/2012 Activity: unrestricted Diet: routine Medications: Ibuprofen, Percocet and HARRT medications Condition: stable Instructions: refer to practice specific booklet Discharge to: home Follow-up Information   Follow up with [facility] In 5 days. (Follow up for evaluation of incision and staple removal as needed)       Newborn Data: Live born female  Birth Weight: 6 lb 15.8 oz (3170 g) APGAR: 7, 8  Home with mother.  Gregor Hams 12/30/2012, 9:19 AM  Patient doing well on POD#2 s/p repeat cesarean section with BTL. Discharge instructions provided.  Agree with above assessment and plan

## 2013-01-06 ENCOUNTER — Telehealth (HOSPITAL_COMMUNITY): Payer: Self-pay | Admitting: *Deleted

## 2013-01-06 NOTE — Telephone Encounter (Signed)
Resolve episode 

## 2013-01-19 ENCOUNTER — Other Ambulatory Visit (INDEPENDENT_AMBULATORY_CARE_PROVIDER_SITE_OTHER): Payer: Self-pay

## 2013-01-19 ENCOUNTER — Other Ambulatory Visit: Payer: Self-pay | Admitting: Internal Medicine

## 2013-01-19 DIAGNOSIS — Z113 Encounter for screening for infections with a predominantly sexual mode of transmission: Secondary | ICD-10-CM

## 2013-01-19 DIAGNOSIS — B20 Human immunodeficiency virus [HIV] disease: Secondary | ICD-10-CM

## 2013-01-19 DIAGNOSIS — Z79899 Other long term (current) drug therapy: Secondary | ICD-10-CM

## 2013-01-19 LAB — COMPLETE METABOLIC PANEL WITH GFR
ALT: 15 U/L (ref 0–35)
AST: 20 U/L (ref 0–37)
Alkaline Phosphatase: 94 U/L (ref 39–117)
BUN: 10 mg/dL (ref 6–23)
Calcium: 9.2 mg/dL (ref 8.4–10.5)
Chloride: 103 mEq/L (ref 96–112)
Creat: 0.57 mg/dL (ref 0.50–1.10)
Total Bilirubin: 0.6 mg/dL (ref 0.3–1.2)

## 2013-01-19 LAB — CBC WITH DIFFERENTIAL/PLATELET
Eosinophils Relative: 3 % (ref 0–5)
Hemoglobin: 10.7 g/dL — ABNORMAL LOW (ref 12.0–15.0)
Lymphocytes Relative: 42 % (ref 12–46)
Lymphs Abs: 1.2 10*3/uL (ref 0.7–4.0)
MCV: 71.9 fL — ABNORMAL LOW (ref 78.0–100.0)
Monocytes Relative: 11 % (ref 3–12)
Neutrophils Relative %: 44 % (ref 43–77)
Platelets: 427 10*3/uL — ABNORMAL HIGH (ref 150–400)
RBC: 4.8 MIL/uL (ref 3.87–5.11)
WBC: 2.7 10*3/uL — ABNORMAL LOW (ref 4.0–10.5)

## 2013-01-20 LAB — T-HELPER CELL (CD4) - (RCID CLINIC ONLY): CD4 T Cell Abs: 340 uL — ABNORMAL LOW (ref 400–2700)

## 2013-01-20 LAB — HIV-1 RNA QUANT-NO REFLEX-BLD
HIV 1 RNA Quant: <20 copies/mL (ref <20)
HIV-1 RNA Quant, Log: <1.3 {Log} (ref <1.30)

## 2013-01-21 ENCOUNTER — Other Ambulatory Visit: Payer: Self-pay | Admitting: *Deleted

## 2013-01-21 DIAGNOSIS — B20 Human immunodeficiency virus [HIV] disease: Secondary | ICD-10-CM

## 2013-01-21 MED ORDER — EMTRICITABINE-TENOFOVIR DF 200-300 MG PO TABS: 1.0000 | ORAL_TABLET | Freq: Every day | ORAL | Status: DC

## 2013-01-21 MED ORDER — RALTEGRAVIR POTASSIUM 400 MG PO TABS: 400.0000 mg | ORAL_TABLET | Freq: Two times a day (BID) | ORAL | Status: DC

## 2013-01-21 NOTE — Telephone Encounter (Signed)
Printed to go with patient ADAP application. 

## 2013-01-25 ENCOUNTER — Encounter: Payer: Self-pay | Admitting: Obstetrics & Gynecology

## 2013-01-25 ENCOUNTER — Ambulatory Visit (INDEPENDENT_AMBULATORY_CARE_PROVIDER_SITE_OTHER): Payer: Self-pay | Admitting: Obstetrics & Gynecology

## 2013-01-25 NOTE — Progress Notes (Signed)
  Subjective:    Patient ID: Catherine Martinez, female    DOB: 04-Jun-1986, 27 y.o.   MRN: [id number]  HPI  Jazlyn is a 27 yo P4 now 6 weeks s/p LTCS and PPS. She has no complaints. Her baby is growing well with formula feedings. She reports that her breasts are nearly dried up. She has not had sex yet. She denies post partum depression. She reports normal bowel and bladder function.  Review of Systems   Pap normal 11/2012 Objective:   Physical Exam  Well-healed Pfanensteil incision Benign abdomen      Assessment & Plan:  Post partum/post op- doing well RTC 1 year/prn sooner.

## 2013-02-03 ENCOUNTER — Ambulatory Visit: Payer: Self-pay

## 2013-02-03 ENCOUNTER — Encounter: Payer: Self-pay | Admitting: Infectious Disease

## 2013-02-03 ENCOUNTER — Ambulatory Visit (INDEPENDENT_AMBULATORY_CARE_PROVIDER_SITE_OTHER): Payer: Self-pay | Admitting: Infectious Disease

## 2013-02-03 VITALS — BP 124/86 | HR 69 | Temp 98.3°F | Ht 64.0 in | Wt 155.0 lb

## 2013-02-03 DIAGNOSIS — A599 Trichomoniasis, unspecified: Secondary | ICD-10-CM

## 2013-02-03 DIAGNOSIS — B20 Human immunodeficiency virus [HIV] disease: Secondary | ICD-10-CM

## 2013-02-03 MED ORDER — DOLUTEGRAVIR SODIUM 50 MG PO TABS: 50.0000 mg | ORAL_TABLET | Freq: Every day | ORAL | Status: DC

## 2013-02-03 MED ORDER — EMTRICITABINE-TENOFOVIR DF 200-300 MG PO TABS: 1.0000 | ORAL_TABLET | Freq: Every day | ORAL | Status: DC

## 2013-02-03 NOTE — Progress Notes (Signed)
  Subjective:    Patient ID: Catherine Martinez, female    DOB: Oct 04, 1986, 27 y.o.   MRN: [id number]  HPI   27 year old African American lady with HIV previously diagnosed with last pregnancy and taken care of by Dr Philipp Deputy --last seen in 2011. She lost medicaid post pregnancy and apparently did not know of the ADAP program and was lost to followup, and became pregnant   We put her on isentress and truvada (we had samples of this and this regimen could drop her VL more rapidly than on INI regimen.  She had suppressed VL <20 and CD4>300. She states she has been off meds for one week because waiting on ADAP approval, application processed approx 2 wks ago. Had C section and T ligation. Son doing well. No breast feeding.       Review of Systems  Constitutional: Negative for fever, chills, diaphoresis, activity change, appetite change, fatigue and unexpected weight change.  HENT: Negative for congestion, sore throat, rhinorrhea, sneezing, trouble swallowing and sinus pressure.   Eyes: Negative for photophobia and visual disturbance.  Respiratory: Negative for cough, chest tightness, shortness of breath, wheezing and stridor.   Cardiovascular: Negative for chest pain, palpitations and leg swelling.  Gastrointestinal: Positive for abdominal distention. Negative for nausea, vomiting, abdominal pain, diarrhea, constipation, blood in stool and anal bleeding.  Genitourinary: Negative for dysuria, hematuria, flank pain and difficulty urinating.  Musculoskeletal: Negative for myalgias, back pain, joint swelling, arthralgias and gait problem.  Skin: Negative for color change, pallor, rash and wound.  Neurological: Negative for dizziness, tremors, weakness and light-headedness.  Hematological: Negative for adenopathy. Does not bruise/bleed easily.  Psychiatric/Behavioral: Negative for behavioral problems, confusion, sleep disturbance, dysphoric mood, decreased concentration and agitation.       Objective:    Physical Exam  Constitutional: She is oriented to person, place, and time. She appears well-developed and well-nourished. No distress.  HENT:  Head: Normocephalic and atraumatic.  Mouth/Throat: Oropharynx is clear and moist. No oropharyngeal exudate.  Eyes: Conjunctivae and EOM are normal. Pupils are equal, round, and reactive to light. No scleral icterus.  Neck: Normal range of motion. Neck supple. No JVD present.  Cardiovascular: Normal rate, regular rhythm and normal heart sounds.  Exam reveals no gallop and no friction rub.   No murmur heard. Pulmonary/Chest: Effort normal and breath sounds normal. No respiratory distress. She has no wheezes. She has no rales. She exhibits no tenderness.  Abdominal: She exhibits no mass. There is no tenderness. There is no rebound and no guarding.  Musculoskeletal: She exhibits no edema and no tenderness.  Lymphadenopathy:    She has no cervical adenopathy.  Neurological: She is alert and oriented to person, place, and time. She has normal reflexes. She exhibits normal muscle tone. Coordination normal.  Skin: Skin is warm and dry. She is not diaphoretic. No erythema. No pallor.  Psychiatric: She has a normal mood and affect. Her behavior is normal. Judgment and thought content normal.          Assessment & Plan:  HIV: change to [place] and truvada which will have similar tolerability and  Higher barrier to R   Trichomonas: was treated during hospitalization

## 2013-02-24 ENCOUNTER — Other Ambulatory Visit: Payer: Self-pay

## 2013-03-10 ENCOUNTER — Encounter: Payer: Self-pay | Admitting: *Deleted

## 2013-03-10 ENCOUNTER — Ambulatory Visit: Payer: Self-pay | Admitting: Infectious Disease

## 2013-03-25 ENCOUNTER — Emergency Department (HOSPITAL_COMMUNITY)
Admission: EM | Admit: 2013-03-25 | Discharge: 2013-03-25 | Disposition: A | Discharge status: Home / Self Care | Payer: Medicaid Other | Attending: Emergency Medicine | Admitting: Emergency Medicine

## 2013-03-25 ENCOUNTER — Encounter (HOSPITAL_COMMUNITY): Payer: Self-pay | Admitting: Family Medicine

## 2013-03-25 DIAGNOSIS — Z79899 Other long term (current) drug therapy: Secondary | ICD-10-CM | POA: Insufficient documentation

## 2013-03-25 DIAGNOSIS — Z862 Personal history of diseases of the blood and blood-forming organs and certain disorders involving the immune mechanism: Secondary | ICD-10-CM | POA: Insufficient documentation

## 2013-03-25 DIAGNOSIS — Z87891 Personal history of nicotine dependence: Secondary | ICD-10-CM | POA: Insufficient documentation

## 2013-03-25 DIAGNOSIS — Z8742 Personal history of other diseases of the female genital tract: Secondary | ICD-10-CM | POA: Insufficient documentation

## 2013-03-25 DIAGNOSIS — O09299 Supervision of pregnancy with other poor reproductive or obstetric history, unspecified trimester: Secondary | ICD-10-CM | POA: Insufficient documentation

## 2013-03-25 DIAGNOSIS — B029 Zoster without complications: Secondary | ICD-10-CM | POA: Insufficient documentation

## 2013-03-25 DIAGNOSIS — Z21 Asymptomatic human immunodeficiency virus [HIV] infection status: Secondary | ICD-10-CM | POA: Insufficient documentation

## 2013-03-25 MED ORDER — OXYCODONE-ACETAMINOPHEN 5-325 MG PO TABS: 1.0000 | ORAL_TABLET | ORAL | Status: DC | PRN

## 2013-03-25 MED ORDER — ACYCLOVIR 400 MG PO TABS: 800.0000 mg | ORAL_TABLET | Freq: Every day | ORAL | Status: DC

## 2013-03-25 NOTE — ED Notes (Signed)
Per pt sts she woke up this am with rash on her back and under left arm. sts painful.

## 2013-03-25 NOTE — ED Notes (Signed)
Pt woke this am with painful rash left upper back and under left arm. Rash appears to be shingles.

## 2013-03-25 NOTE — ED Provider Notes (Signed)
History     CSN: [id number]  Arrival date & time 03/25/13  1022   First MD Initiated Contact with Patient 03/25/13 1045      Chief Complaint  Patient presents with  . Rash    (Consider location/radiation/quality/duration/timing/severity/associated sxs/prior treatment) HPI Comments: Patient with hx HIV (VL,20, CD4>300) p/w painful rash across left back and left ribs that began this morning. Pain is 2/10.  No itching.  Denies recent illness, fever, chills, myalgias, cough, sore throat, abdominal pain, N/V/D.  Has recent emotional stress as 39 year old cousin died.  Is taking her HIV medications without problems.   Patient is a 27 y.o. female presenting with rash. The history is provided by the patient.  Rash Associated symptoms: no chest pain, no chills, no cough, no diarrhea, no dysuria, no fever, no nausea, no shortness of breath, no sore throat, no vaginal bleeding, no vaginal discharge and no vomiting     Past Medical History  Diagnosis Date  . HIV (human immunodeficiency virus infection)   . History of stillbirth   . Kidney infection     Recurrent  . SVD (spontaneous vaginal delivery)     x 4  . Headache(784.0)     otc med prn  . Anemia     Past Surgical History  Procedure Laterality Date  . No past surgeries    . Cesarean section with bilateral tubal ligation N/A 12/28/2012    Procedure: CESAREAN SECTION WITH BILATERAL TUBAL LIGATION;  Surgeon: [name], MD;  Location: [facility];  Service: Obstetrics;  Laterality: N/A;  AZT prior to section    Family History  Problem Relation Age of Onset  . Other Neg Hx   . Diabetes Mother   . Diabetes Maternal Aunt   . Diabetes Maternal Grandfather   . Hyperlipidemia Maternal Grandfather     History  Substance Use Topics  . Smoking status: Former Smoker -- 1.00 packs/day for 1 years    Types: Cigarettes    Quit date: 10/21/2009  . Smokeless tobacco: Never Used  . Alcohol Use: No    OB History   Grav Para Term Preterm  Abortions TAB SAB Ect Mult Living   5 5 5       4       Review of Systems  Constitutional: Negative for fever and chills.  HENT: Negative for sore throat.   Respiratory: Negative for cough and shortness of breath.   Cardiovascular: Negative for chest pain and leg swelling.  Gastrointestinal: Negative for nausea, vomiting, abdominal pain and diarrhea.  Genitourinary: Negative for dysuria, urgency, frequency, vaginal bleeding and vaginal discharge.  Skin: Positive for rash.  Neurological: Negative for weakness and numbness.    Allergies  Sulfamethoxazole w-trimethoprim  Home Medications   Current Outpatient Rx  Name  Route  Sig  Dispense  Refill  . Dolutegravir Sodium (TIVICAY) 50 MG TABS   Oral   Take 1 tablet (50 mg total) by mouth daily.   30 tablet   11   . emtricitabine-tenofovir (TRUVADA) 200-300 MG per tablet   Oral   Take 1 tablet by mouth daily.   30 tablet   11     BP 126/95  Pulse 86  Temp(Src) 98.8 F (37.1 C) (Oral)  Resp 20  SpO2 100%  LMP 03/12/2013  Physical Exam  Nursing note and vitals reviewed. Constitutional: She appears well-developed and well-nourished. No distress.  HENT:  Head: Normocephalic and atraumatic.  Eyes: Conjunctivae are normal.  Neck: Neck  supple.  Cardiovascular: Normal rate and regular rhythm.   Pulmonary/Chest: Effort normal and breath sounds normal. No respiratory distress. She has no wheezes. She has no rales.  Abdominal: Soft. She exhibits no distension. There is no tenderness.  Neurological: She is alert.  Skin: Rash noted. She is not diaphoretic.       ED Course  Procedures (including critical care time)  Labs Reviewed - No data to display No results found.   1. Herpes zoster     MDM  Pt is afebrile, nontoxic pt with dermatomal rash over left back and left ribs, c/w herpes zoster.  Pt did have chicken pox as a child.  No other symptoms including respiratory symptoms, no facial rash or eye complaints.  She  is very well appearing, has been taking her antiretroviral therapy consistently, last CD4 >300 and VL very low.  Pt has baby at home but is not breastfeeding.  Pt to be treated as outpatient with acyclovir and pain medication, ID follow up (Dr Daiva Eves is her doctor).  Discussed findings, treatment plan, follow up with patient.  Pt given return precautions.  Pt verbalizes understanding and agrees with plan.           Trixie Dredge, PA-C 03/25/13 1407

## 2013-03-27 NOTE — ED Provider Notes (Signed)
Medical screening examination/treatment/procedure(s) were performed by non-physician practitioner and as supervising physician I was immediately available for consultation/collaboration.    Gabrian Hoque D Arianis Bowditch, MD 03/27/13 0913 

## 2013-04-01 ENCOUNTER — Emergency Department (HOSPITAL_COMMUNITY)
Admission: EM | Admit: 2013-04-01 | Discharge: 2013-04-01 | Disposition: A | Discharge status: Home / Self Care | Payer: Medicaid Other | Attending: Emergency Medicine | Admitting: Emergency Medicine

## 2013-04-01 ENCOUNTER — Encounter (HOSPITAL_COMMUNITY): Payer: Self-pay | Admitting: Emergency Medicine

## 2013-04-01 ENCOUNTER — Emergency Department (HOSPITAL_COMMUNITY): Payer: Medicaid Other

## 2013-04-01 DIAGNOSIS — B029 Zoster without complications: Secondary | ICD-10-CM | POA: Insufficient documentation

## 2013-04-01 DIAGNOSIS — R21 Rash and other nonspecific skin eruption: Secondary | ICD-10-CM | POA: Insufficient documentation

## 2013-04-01 DIAGNOSIS — Z87440 Personal history of urinary (tract) infections: Secondary | ICD-10-CM | POA: Insufficient documentation

## 2013-04-01 DIAGNOSIS — R42 Dizziness and giddiness: Secondary | ICD-10-CM | POA: Insufficient documentation

## 2013-04-01 DIAGNOSIS — Z21 Asymptomatic human immunodeficiency virus [HIV] infection status: Secondary | ICD-10-CM | POA: Insufficient documentation

## 2013-04-01 DIAGNOSIS — R509 Fever, unspecified: Secondary | ICD-10-CM

## 2013-04-01 DIAGNOSIS — Z87891 Personal history of nicotine dependence: Secondary | ICD-10-CM | POA: Insufficient documentation

## 2013-04-01 DIAGNOSIS — N390 Urinary tract infection, site not specified: Secondary | ICD-10-CM | POA: Insufficient documentation

## 2013-04-01 DIAGNOSIS — Z3202 Encounter for pregnancy test, result negative: Secondary | ICD-10-CM | POA: Insufficient documentation

## 2013-04-01 DIAGNOSIS — E860 Dehydration: Secondary | ICD-10-CM | POA: Insufficient documentation

## 2013-04-01 DIAGNOSIS — R112 Nausea with vomiting, unspecified: Secondary | ICD-10-CM | POA: Insufficient documentation

## 2013-04-01 DIAGNOSIS — Z862 Personal history of diseases of the blood and blood-forming organs and certain disorders involving the immune mechanism: Secondary | ICD-10-CM | POA: Insufficient documentation

## 2013-04-01 DIAGNOSIS — R55 Syncope and collapse: Secondary | ICD-10-CM | POA: Insufficient documentation

## 2013-04-01 LAB — URINALYSIS, ROUTINE W REFLEX MICROSCOPIC
Bilirubin Urine: NEGATIVE
Glucose, UA: NEGATIVE mg/dL
Ketones, ur: NEGATIVE mg/dL
pH: 6.5 (ref 5.0–8.0)

## 2013-04-01 LAB — URINE MICROSCOPIC-ADD ON

## 2013-04-01 LAB — CBC WITH DIFFERENTIAL/PLATELET
Hemoglobin: 11 g/dL — ABNORMAL LOW (ref 12.0–15.0)
Lymphocytes Relative: 11 % — ABNORMAL LOW (ref 12–46)
Lymphs Abs: 0.9 10*3/uL (ref 0.7–4.0)
Monocytes Relative: 13 % — ABNORMAL HIGH (ref 3–12)
Neutrophils Relative %: 75 % (ref 43–77)
Platelets: 198 10*3/uL (ref 150–400)
RBC: 4.79 MIL/uL (ref 3.87–5.11)
WBC: 7.8 10*3/uL (ref 4.0–10.5)

## 2013-04-01 LAB — COMPREHENSIVE METABOLIC PANEL
ALT: 52 U/L — ABNORMAL HIGH (ref 0–35)
AST: 89 U/L — ABNORMAL HIGH (ref 0–37)
Albumin: 2.8 g/dL — ABNORMAL LOW (ref 3.5–5.2)
Alkaline Phosphatase: 107 U/L (ref 39–117)
Chloride: 97 mEq/L (ref 96–112)
Potassium: 4.6 mEq/L (ref 3.5–5.1)
Sodium: 130 mEq/L — ABNORMAL LOW (ref 135–145)
Total Bilirubin: 1.3 mg/dL — ABNORMAL HIGH (ref 0.3–1.2)

## 2013-04-01 MED ORDER — ONDANSETRON 4 MG PO TBDP
4.0000 mg | ORAL_TABLET | Freq: Once | ORAL | Status: AC
  Administered 2013-04-01: 4 mg via ORAL
  Filled 2013-04-01: qty 1

## 2013-04-01 MED ORDER — ONDANSETRON HCL 4 MG/2ML IJ SOLN
4.0000 mg | Freq: Once | INTRAMUSCULAR | Status: AC
  Administered 2013-04-01: 4 mg via INTRAVENOUS
  Filled 2013-04-01: qty 2

## 2013-04-01 MED ORDER — MORPHINE SULFATE 4 MG/ML IJ SOLN
4.0000 mg | Freq: Once | INTRAMUSCULAR | Status: AC
  Administered 2013-04-01: 4 mg via INTRAVENOUS
  Filled 2013-04-01 (×2): qty 1

## 2013-04-01 MED ORDER — OXYCODONE-ACETAMINOPHEN 5-325 MG PO TABS: 2.0000 | ORAL_TABLET | ORAL | Status: DC | PRN

## 2013-04-01 MED ORDER — SODIUM CHLORIDE 0.9 % IV BOLUS (SEPSIS)
1000.0000 mL | Freq: Once | INTRAVENOUS | Status: AC
  Administered 2013-04-01: 1000 mL via INTRAVENOUS

## 2013-04-01 MED ORDER — MORPHINE SULFATE 4 MG/ML IJ SOLN
4.0000 mg | Freq: Once | INTRAMUSCULAR | Status: AC
  Administered 2013-04-01: 4 mg via INTRAVENOUS
  Filled 2013-04-01: qty 1

## 2013-04-01 MED ORDER — ACETAMINOPHEN 325 MG PO TABS: 650.0000 mg | ORAL_TABLET | Freq: Once | ORAL | Status: DC

## 2013-04-01 MED ORDER — SODIUM CHLORIDE 0.9 % IV SOLN
Freq: Once | INTRAVENOUS | Status: AC
  Administered 2013-04-01: 02:00:00 via INTRAVENOUS

## 2013-04-01 MED ORDER — CEPHALEXIN 500 MG PO CAPS: 500.0000 mg | ORAL_CAPSULE | Freq: Two times a day (BID) | ORAL | Status: DC

## 2013-04-01 MED ORDER — PROMETHAZINE HCL 25 MG PO TABS: 25.0000 mg | ORAL_TABLET | Freq: Four times a day (QID) | ORAL | Status: DC | PRN

## 2013-04-01 MED ORDER — SODIUM CHLORIDE 0.9 % IV SOLN
Freq: Once | INTRAVENOUS | Status: AC
  Administered 2013-04-01: 04:00:00 via INTRAVENOUS

## 2013-04-01 MED ORDER — DEXTROSE 5 % IV SOLN
1.0000 g | Freq: Once | INTRAVENOUS | Status: AC
  Administered 2013-04-01: 1 g via INTRAVENOUS
  Filled 2013-04-01: qty 10

## 2013-04-01 NOTE — ED Provider Notes (Signed)
History     CSN: [id number]  Arrival date & time 04/01/13  0023   First MD Initiated Contact with Patient 04/01/13 0153      Chief Complaint  Patient presents with  . Emesis  . Near Syncope    (Consider location/radiation/quality/duration/timing/severity/associated sxs/prior treatment) HPI  Catherine Martinez is a 27 y.o. female with past medical history significant for HIV AIDS (last CD4 count was 174 in February of 2014) coming in with multiple episodes of nonbloody, nonbilious, no coffee ground emesis starting this a.m associated with low-grade fever. Patient felt woozy sensation when going from sitting to standing and thought that she was becoming dehydrated she presented to the ED. States she's been compliant with her antiretroviral medications. She denies abdominal pain, headache, neck stiffness, change in vision, dysarthria, ataxia, chest pain, shortness of breath, cough, rhinorrhea, sinus congestion, otalgia, dysuria, urinary frequency, diarrhea, constipation.  Past Medical History  Diagnosis Date  . HIV (human immunodeficiency virus infection)   . History of stillbirth   . Kidney infection     Recurrent  . SVD (spontaneous vaginal delivery)     x 4  . Headache(784.0)     otc med prn  . Anemia     Past Surgical History  Procedure Laterality Date  . No past surgeries    . Cesarean section with bilateral tubal ligation N/A 12/28/2012    Procedure: CESAREAN SECTION WITH BILATERAL TUBAL LIGATION;  Surgeon: [name], MD;  Location: [facility];  Service: Obstetrics;  Laterality: N/A;  AZT prior to section    Family History  Problem Relation Age of Onset  . Other Neg Hx   . Diabetes Mother   . Diabetes Maternal Aunt   . Diabetes Maternal Grandfather   . Hyperlipidemia Maternal Grandfather     History  Substance Use Topics  . Smoking status: Former Smoker -- 1.00 packs/day for 1 years    Types: Cigarettes    Quit date: 10/21/2009  . Smokeless tobacco: Never Used  .  Alcohol Use: No    OB History   Grav Para Term Preterm Abortions TAB SAB Ect Mult Living   5 5 5       4       Review of Systems  Constitutional: Positive for fever.  Respiratory: Negative for shortness of breath.   Cardiovascular: Negative for chest pain.  Gastrointestinal: Positive for nausea and vomiting. Negative for abdominal pain and diarrhea.  Neurological: Positive for light-headedness.  All other systems reviewed and are negative.    Allergies  Sulfamethoxazole w-trimethoprim  Home Medications   Current Outpatient Rx  Name  Route  Sig  Dispense  Refill  . acyclovir (ZOVIRAX) 400 MG tablet   Oral   Take 2 tablets (800 mg total) by mouth 5 (five) times daily. X 5 days   50 tablet   0   . Dolutegravir Sodium (TIVICAY) 50 MG TABS   Oral   Take 1 tablet (50 mg total) by mouth daily.   30 tablet   11   . emtricitabine-tenofovir (TRUVADA) 200-300 MG per tablet   Oral   Take 1 tablet by mouth daily.   30 tablet   11   . oxyCODONE-acetaminophen (PERCOCET/ROXICET) 5-325 MG per tablet   Oral   Take 1 tablet by mouth every 4 (four) hours as needed for pain.   20 tablet   0     BP 116/69  Pulse 116  Temp(Src) 102.9 F (39.4 C) (Oral)  Resp 20  SpO2 100%  LMP 03/12/2013  Physical Exam  Nursing note and vitals reviewed. Constitutional: She is oriented to person, place, and time. She appears well-developed and well-nourished. No distress.  HENT:  Head: Normocephalic.  DRy mm  Eyes: Conjunctivae and EOM are normal. Pupils are equal, round, and reactive to light.  Neck: Normal range of motion. Neck supple.  Cardiovascular: Normal rate, regular rhythm and normal heart sounds.   Pulmonary/Chest: Effort normal and breath sounds normal. No stridor. No respiratory distress. She has no wheezes. She has no rales. She exhibits no tenderness.  Abdominal: Soft. Bowel sounds are normal. She exhibits no distension and no mass. There is no tenderness. There is no  rebound and no guarding.  Genitourinary:  No CVA tenderness bilaterally  Musculoskeletal: Normal range of motion.  Neurological: She is alert and oriented to person, place, and time.  Skin:  Vesicular rash to left sided T5 distribution  Psychiatric: She has a normal mood and affect.    ED Course  Procedures (including critical care time)  Labs Reviewed  CBC WITH DIFFERENTIAL - Abnormal; Notable for the following:    Hemoglobin 11.0 (*)    HCT 32.9 (*)    MCV 68.7 (*)    MCH 23.0 (*)    Lymphocytes Relative 11 (*)    Monocytes Relative 13 (*)    All other components within normal limits  COMPREHENSIVE METABOLIC PANEL - Abnormal; Notable for the following:    Sodium 130 (*)    Glucose, Bld 120 (*)    Total Protein 9.3 (*)    Albumin 2.8 (*)    AST 89 (*)    ALT 52 (*)    Total Bilirubin 1.3 (*)    All other components within normal limits  URINALYSIS, ROUTINE W REFLEX MICROSCOPIC - Abnormal; Notable for the following:    APPearance CLOUDY (*)    Hgb urine dipstick SMALL (*)    Protein, ur 30 (*)    Urobilinogen, UA 2.0 (*)    Nitrite POSITIVE (*)    Leukocytes, UA LARGE (*)    All other components within normal limits  GLUCOSE, CAPILLARY - Abnormal; Notable for the following:    Glucose-Capillary 118 (*)    All other components within normal limits  URINE MICROSCOPIC-ADD ON - Abnormal; Notable for the following:    Squamous Epithelial / LPF MANY (*)    Bacteria, UA MANY (*)    All other components within normal limits  URINE CULTURE  LIPASE, BLOOD  PREGNANCY, URINE  CG4 I-STAT (LACTIC ACID)   Dg Chest 2 View  04/01/2013   *RADIOLOGY REPORT*  Clinical Data: Shingles in the upper back.  Cough and syncope.  CHEST - 2 VIEW  Comparison: None.  Findings: The heart size and pulmonary vascularity are normal. The lungs appear clear and expanded without focal air space disease or consolidation. No blunting of the costophrenic angles.  No pneumothorax.  Mediastinal contours  appear intact.  IMPRESSION: No evidence of active pulmonary disease.   Original Report Authenticated By: Burman Nieves, M.D.     1. UTI (lower urinary tract infection)   2. Zoster   3. Fever   4. Nausea and vomiting in adult       MDM   Filed Vitals:   04/01/13 0031  BP: 116/69  Pulse: 116  Temp: 102.9 F (39.4 C)  TempSrc: Oral  Resp: 20  SpO2: 100%     Catherine Martinez is a 27 y.o. female with HIV AIDS  resenting with fever and multiple episodes of nonbloody, nonbilious emesis. Patient has white blood cell count of 7.8, this has increased from 2.7 approximately 10 days ago, consider leukocytosis. Urinalysis is highly contaminated however there are many white blood cells with positive nitrite and large leukocytes. I will treat her for urinary tract infection with IV Rocephin and send her home on Keflex. Chest x-ray shows no infiltrate. Patient is tolerating by mouth.  Discussed case with attending who agrees with plan and stability to d/c to home.    Medications  acetaminophen (TYLENOL) tablet 650 mg (not administered)  morphine 4 MG/ML injection 4 mg (not administered)  ondansetron (ZOFRAN-ODT) disintegrating tablet 4 mg (not administered)  0.9 %  sodium chloride infusion ( Intravenous New Bag/Given 04/01/13 0136)  ondansetron (ZOFRAN) injection 4 mg (4 mg Intravenous Given 04/01/13 0136)    The patient is hemodynamically stable, appropriate for, and amenable to, discharge at this time. Pt verbalized understanding and agrees with care plan. Outpatient follow-up and return precautions given.    Discharge Medication List as of 04/01/2013  4:44 AM    START taking these medications   Details  cephALEXin (KEFLEX) 500 MG capsule Take 1 capsule (500 mg total) by mouth 2 (two) times daily., Starting 04/01/2013, Until Discontinued, Print    !! oxyCODONE-acetaminophen (PERCOCET/ROXICET) 5-325 MG per tablet Take 2 tablets by mouth every 4 (four) hours as needed for pain., Starting  04/01/2013, Until Discontinued, Print    promethazine (PHENERGAN) 25 MG tablet Take 1 tablet (25 mg total) by mouth every 6 (six) hours as needed for nausea., Starting 04/01/2013, Until Discontinued, Print     !! - Potential duplicate medications found. Please discuss with provider.            Wynetta Emery, PA-C 04/02/13 [phone]

## 2013-04-01 NOTE — ED Notes (Signed)
ZOX:[id number]<AV> Expected date:04/01/13<BR> Expected time:12:08 AM<BR> Means of arrival:Ambulance<BR> Comments:<BR> N,v orthostatic

## 2013-04-01 NOTE — ED Notes (Signed)
Brought in by EMS from home with c/o emesis and weakness with near-syncope.  Per EMS, pt reported that she has been vomiting persistently since yesterday and can not keep anything down; pt denies abdominal pain; pt also reported that she has "active shingles" on her left upper back and under left breast area; pt has HIV.

## 2013-04-03 LAB — URINE CULTURE

## 2013-04-03 NOTE — ED Provider Notes (Signed)
Medical screening examination/treatment/procedure(s) were performed by non-physician practitioner and as supervising physician I was immediately available for consultation/collaboration.  Ritchie Klee, MD 04/03/13 2322 

## 2013-04-04 ENCOUNTER — Telehealth (HOSPITAL_COMMUNITY): Payer: Self-pay | Admitting: Emergency Medicine

## 2013-04-04 NOTE — ED Notes (Signed)
Post ED Visit - Positive Culture Follow-up  Culture report reviewed by antimicrobial stewardship pharmacist: []  Marlou Sa, Pharm.D., BCPS []  Celedonio Miyamoto, Pharm.D., BCPS []  Georgina Pillion, [address].D., BCPS []  [place], [address].D., BCPS, AAHIVP []  Estella Husk, Pharm.D., BCPS, AAHIVP [x]  Abran Duke, [address].D., BCPS  Positive urine culture Treated with Keflex, organism sensitive to the same and no further patient follow-up is required at this time.  Kylie A Holland 04/04/2013, 10:32 AM

## 2013-05-05 ENCOUNTER — Encounter: Payer: Self-pay | Admitting: *Deleted

## 2013-08-12 ENCOUNTER — Encounter: Payer: Self-pay | Admitting: *Deleted

## 2013-08-26 ENCOUNTER — Other Ambulatory Visit: Payer: Self-pay

## 2014-01-19 ENCOUNTER — Encounter: Payer: Self-pay | Admitting: *Deleted

## 2014-08-22 ENCOUNTER — Encounter (HOSPITAL_COMMUNITY): Payer: Self-pay | Admitting: Emergency Medicine

## 2015-08-23 ENCOUNTER — Encounter (HOSPITAL_COMMUNITY): Payer: Self-pay

## 2015-08-23 ENCOUNTER — Encounter (HOSPITAL_COMMUNITY)
Admission: RE | Admit: 2015-08-23 | Discharge: 2015-08-23 | Disposition: A | Discharge status: Home / Self Care | Payer: Medicaid Other | Source: Ambulatory Visit | Attending: Oral Surgery | Admitting: Oral Surgery

## 2015-08-23 DIAGNOSIS — K0530 Chronic periodontitis, unspecified: Secondary | ICD-10-CM | POA: Insufficient documentation

## 2015-08-23 DIAGNOSIS — K029 Dental caries, unspecified: Secondary | ICD-10-CM | POA: Insufficient documentation

## 2015-08-23 DIAGNOSIS — Z01812 Encounter for preprocedural laboratory examination: Secondary | ICD-10-CM | POA: Diagnosis present

## 2015-08-23 LAB — CBC
HCT: 34 % — ABNORMAL LOW (ref 36.0–46.0)
HEMOGLOBIN: 10.6 g/dL — AB (ref 12.0–15.0)
MCH: 22.4 pg — AB (ref 26.0–34.0)
MCHC: 31.2 g/dL (ref 30.0–36.0)
MCV: 71.7 fL — AB (ref 78.0–100.0)
Platelets: 254 10*3/uL (ref 150–400)
RBC: 4.74 MIL/uL (ref 3.87–5.11)
RDW: 14.1 % (ref 11.5–15.5)
WBC: 2.9 10*3/uL — AB (ref 4.0–10.5)

## 2015-08-23 LAB — BASIC METABOLIC PANEL
Anion gap: 6 (ref 5–15)
BUN: 10 mg/dL (ref 6–20)
CO2: 26 mmol/L (ref 22–32)
Calcium: 9 mg/dL (ref 8.9–10.3)
Chloride: 103 mmol/L (ref 101–111)
Creatinine, Ser: 0.83 mg/dL (ref 0.44–1.00)
GFR calc non Af Amer: >60 mL/min (ref >60)
Glucose, Bld: 102 mg/dL — ABNORMAL HIGH (ref 65–99)
Potassium: 3.7 mmol/L (ref 3.5–5.1)
Sodium: 135 mmol/L (ref 135–145)

## 2015-08-23 LAB — HCG, SERUM, QUALITATIVE: PREG SERUM: NEGATIVE

## 2015-08-23 NOTE — Pre-Procedure Instructions (Addendum)
Catherine Martinez  08/23/2015      [facility] - [place] ([place]), [place] - [address] [place] ([place]) [place] [address] Phone: [phone] Fax: [phone]  [facility] Catherine Martinez, [place] - [address] AT [facility] OF [address] Catherine Martinez [place] [address] Phone: [phone] Fax: [phone]  [facility] [address] Catherine Martinez, [place] - [address] AT [facility] OF [facility] [address] [name] [address] Phone: [phone] Fax: [phone]    Your procedure is scheduled on 08/25/15  Report to [facility] short stay admitting at 530 A.M.  Call this number if you have problems the morning of surgery:  [phone]   Remember:  Do not eat food or drink liquids after midnight.  Take these medicines the morning of surgery with A SIP OF WATER acyclovir, truvada,trivicay   STOP all herbel meds, nsaids (aleve,naproxen,advil,ibuprofen) starting today,including aspirin, vitamins   Do not wear jewelry, make-up or nail polish.  Do not wear lotions, powders, or perfumes.  You may wear deodorant.  Do not shave 48 hours prior to surgery.  Men may shave face and neck.  Do not bring valuables to the hospital.  [facility] is not responsible for any belongings or valuables.  Contacts, dentures or bridgework may not be worn into surgery.  Leave your suitcase in the car.  After surgery it may be brought to your room.  For patients admitted to the hospital, discharge time will be determined by your treatment team.  Patients discharged the day of surgery will not be allowed to drive home.   Name and phone number of your driver:    Special instructions:   Special Instructions: [facility] - Preparing for Surgery  Before surgery, you can play an important role.  Because skin is not sterile, your skin needs to be as free of germs as possible.  You can reduce the number of germs on you skin by  washing with CHG (chlorahexidine gluconate) soap before surgery.  CHG is an antiseptic cleaner which kills germs and bonds with the skin to continue killing germs even after washing.  Please DO NOT use if you have an allergy to CHG or antibacterial soaps.  If your skin becomes reddened/irritated stop using the CHG and inform your nurse when you arrive at Short Stay.  Do not shave (including legs and underarms) for at least 48 hours prior to the first CHG shower.  You may shave your face.  Please follow these instructions carefully:   1.  Shower with CHG Soap the night before surgery and the morning of Surgery.  2.  If you choose to wash your hair, wash your hair first as usual with your normal shampoo.  3.  After you shampoo, rinse your hair and body thoroughly to remove the Shampoo.  4.  Use CHG as you would any other liquid soap.  You can apply chg directly  to the skin and wash gently with scrungie or a clean washcloth.  5.  Apply the CHG Soap to your body ONLY FROM THE NECK DOWN.  Do not use on open wounds or open sores.  Avoid contact with your eyes ears, mouth and genitals (private parts).  Wash genitals (private parts)       with your normal soap.  6.  Wash thoroughly, paying special attention to the area where your surgery will be performed.  7.  Thoroughly rinse your body with warm water from the neck down.  8.  DO NOT shower/wash with your normal soap after using and rinsing off the CHG Soap.  9.  Pat yourself dry with a clean towel.            10.  Wear clean pajamas.            11.  Place clean sheets on your bed the night of your first shower and do not sleep with pets.  Day of Surgery  Do not apply any lotions/deodorants the morning of surgery.  Please wear clean clothes to the hospital/surgery center.  Please read over the following fact sheets that you were given. Pain Booklet, Coughing and Deep Breathing and Surgical Site Infection Prevention

## 2015-08-24 MED ORDER — CEFAZOLIN SODIUM-DEXTROSE 2-3 GM-% IV SOLR
2.0000 g | INTRAVENOUS | Status: AC
  Administered 2015-08-25: 2 g via INTRAVENOUS
  Filled 2015-08-24: qty 50

## 2015-08-25 ENCOUNTER — Encounter (HOSPITAL_COMMUNITY): Payer: Self-pay

## 2015-08-25 ENCOUNTER — Ambulatory Visit (HOSPITAL_COMMUNITY): Payer: Medicaid Other | Admitting: Anesthesiology

## 2015-08-25 ENCOUNTER — Ambulatory Visit (HOSPITAL_COMMUNITY)
Admission: RE | Admit: 2015-08-25 | Discharge: 2015-08-25 | Disposition: A | Discharge status: Home / Self Care | Payer: Medicaid Other | Source: Ambulatory Visit | Attending: Oral Surgery | Admitting: Oral Surgery

## 2015-08-25 ENCOUNTER — Ambulatory Visit (HOSPITAL_COMMUNITY): Payer: Medicaid Other | Admitting: Emergency Medicine

## 2015-08-25 ENCOUNTER — Encounter (HOSPITAL_COMMUNITY)
Admission: RE | Disposition: A | Discharge status: Home / Self Care | Payer: Self-pay | Source: Ambulatory Visit | Attending: Oral Surgery

## 2015-08-25 DIAGNOSIS — K029 Dental caries, unspecified: Secondary | ICD-10-CM | POA: Insufficient documentation

## 2015-08-25 DIAGNOSIS — Z21 Asymptomatic human immunodeficiency virus [HIV] infection status: Secondary | ICD-10-CM | POA: Diagnosis not present

## 2015-08-25 DIAGNOSIS — Z87891 Personal history of nicotine dependence: Secondary | ICD-10-CM | POA: Insufficient documentation

## 2015-08-25 HISTORY — PX: TOOTH EXTRACTION: SHX859

## 2015-08-25 SURGERY — DENTAL RESTORATION/EXTRACTIONS
Anesthesia: General | Site: Mouth

## 2015-08-25 MED ORDER — FENTANYL CITRATE (PF) 250 MCG/5ML IJ SOLN
INTRAMUSCULAR | Status: AC
  Filled 2015-08-25: qty 5

## 2015-08-25 MED ORDER — MEPERIDINE HCL 25 MG/ML IJ SOLN
6.2500 mg | INTRAMUSCULAR | Status: DC | PRN
Start: 2015-08-25 — End: 2015-08-25

## 2015-08-25 MED ORDER — OXYMETAZOLINE HCL 0.05 % NA SOLN
NASAL | Status: AC
  Filled 2015-08-25: qty 15

## 2015-08-25 MED ORDER — SODIUM CHLORIDE 0.9 % IJ SOLN
INTRAMUSCULAR | Status: AC
  Filled 2015-08-25: qty 10

## 2015-08-25 MED ORDER — PROMETHAZINE HCL 25 MG/ML IJ SOLN: 6.2500 mg | INTRAMUSCULAR | Status: DC | PRN

## 2015-08-25 MED ORDER — PROPOFOL 10 MG/ML IV BOLUS
INTRAVENOUS | Status: AC
  Filled 2015-08-25: qty 20

## 2015-08-25 MED ORDER — LIDOCAINE-EPINEPHRINE 2 %-1:100000 IJ SOLN
INTRAMUSCULAR | Status: AC
  Filled 2015-08-25: qty 1

## 2015-08-25 MED ORDER — PROPOFOL 10 MG/ML IV BOLUS
INTRAVENOUS | Status: DC | PRN
  Administered 2015-08-25: 200 mg via INTRAVENOUS

## 2015-08-25 MED ORDER — AMOXICILLIN 500 MG PO CAPS: 500.0000 mg | ORAL_CAPSULE | Freq: Three times a day (TID) | ORAL | Status: DC

## 2015-08-25 MED ORDER — OXYCODONE-ACETAMINOPHEN 5-325 MG PO TABS: 1.0000 | ORAL_TABLET | ORAL | Status: DC | PRN

## 2015-08-25 MED ORDER — LACTATED RINGERS IV SOLN
INTRAVENOUS | Status: DC | PRN
  Administered 2015-08-25 (×2): via INTRAVENOUS

## 2015-08-25 MED ORDER — LIDOCAINE-EPINEPHRINE 2 %-1:100000 IJ SOLN
INTRAMUSCULAR | Status: DC | PRN
  Administered 2015-08-25: 20 mL via INTRADERMAL

## 2015-08-25 MED ORDER — OXYMETAZOLINE HCL 0.05 % NA SOLN
NASAL | Status: DC | PRN
  Administered 2015-08-25: 1

## 2015-08-25 MED ORDER — ONDANSETRON HCL 4 MG/2ML IJ SOLN
INTRAMUSCULAR | Status: AC
  Filled 2015-08-25: qty 2

## 2015-08-25 MED ORDER — EPHEDRINE SULFATE 50 MG/ML IJ SOLN
INTRAMUSCULAR | Status: AC
  Filled 2015-08-25: qty 1

## 2015-08-25 MED ORDER — ROCURONIUM BROMIDE 50 MG/5ML IV SOLN
INTRAVENOUS | Status: AC
  Filled 2015-08-25: qty 1

## 2015-08-25 MED ORDER — HYDROMORPHONE HCL 1 MG/ML IJ SOLN: 0.2500 mg | INTRAMUSCULAR | Status: DC | PRN

## 2015-08-25 MED ORDER — ONDANSETRON HCL 4 MG/2ML IJ SOLN
INTRAMUSCULAR | Status: DC | PRN
  Administered 2015-08-25: 4 mg via INTRAVENOUS

## 2015-08-25 MED ORDER — LIDOCAINE HCL (CARDIAC) 20 MG/ML IV SOLN
INTRAVENOUS | Status: DC | PRN
  Administered 2015-08-25: 10 mg via INTRAVENOUS

## 2015-08-25 MED ORDER — SODIUM CHLORIDE 0.9 % IR SOLN
Status: DC | PRN
  Administered 2015-08-25: 1000 mL

## 2015-08-25 MED ORDER — ESMOLOL HCL 100 MG/10ML IV SOLN
INTRAVENOUS | Status: DC | PRN
  Administered 2015-08-25 (×3): 10 mg via INTRAVENOUS
  Administered 2015-08-25 (×2): 20 mg via INTRAVENOUS

## 2015-08-25 MED ORDER — ARTIFICIAL TEARS OP OINT
TOPICAL_OINTMENT | OPHTHALMIC | Status: AC
  Filled 2015-08-25: qty 3.5

## 2015-08-25 MED ORDER — LIDOCAINE HCL (CARDIAC) 20 MG/ML IV SOLN
INTRAVENOUS | Status: AC
  Filled 2015-08-25: qty 5

## 2015-08-25 MED ORDER — SUCCINYLCHOLINE CHLORIDE 20 MG/ML IJ SOLN
INTRAMUSCULAR | Status: DC | PRN
  Administered 2015-08-25: 100 mg via INTRAVENOUS

## 2015-08-25 MED ORDER — MIDAZOLAM HCL 2 MG/2ML IJ SOLN: 0.5000 mg | Freq: Once | INTRAMUSCULAR | Status: DC | PRN

## 2015-08-25 MED ORDER — FENTANYL CITRATE (PF) 100 MCG/2ML IJ SOLN
INTRAMUSCULAR | Status: DC | PRN
Start: 2015-08-25 — End: 2015-08-25
  Administered 2015-08-25: 50 ug via INTRAVENOUS
  Administered 2015-08-25: 25 ug via INTRAVENOUS
  Administered 2015-08-25: 75 ug via INTRAVENOUS

## 2015-08-25 MED ORDER — MIDAZOLAM HCL 2 MG/2ML IJ SOLN
INTRAMUSCULAR | Status: AC
  Filled 2015-08-25: qty 4

## 2015-08-25 MED ORDER — MIDAZOLAM HCL 5 MG/5ML IJ SOLN
INTRAMUSCULAR | Status: DC | PRN
  Administered 2015-08-25: 2 mg via INTRAVENOUS

## 2015-08-25 SURGICAL SUPPLY — 32 items
BLADE 10 SAFETY STRL DISP (BLADE) ×3 IMPLANT
BUR CROSS CUT FISSURE 1.6 (BURR) ×2 IMPLANT
BUR CROSS CUT FISSURE 1.6MM (BURR) ×1
BUR EGG ELITE 4.0 (BURR) ×2 IMPLANT
BUR EGG ELITE 4.0MM (BURR) ×1
CANISTER SUCTION 2500CC (MISCELLANEOUS) ×3 IMPLANT
COVER SURGICAL LIGHT HANDLE (MISCELLANEOUS) ×3 IMPLANT
GAUZE PACKING FOLDED 2  STR (GAUZE/BANDAGES/DRESSINGS) ×4
GAUZE PACKING FOLDED 2 STR (GAUZE/BANDAGES/DRESSINGS) ×2 IMPLANT
GLOVE BIO SURGEON STRL SZ 6.5 (GLOVE) ×2 IMPLANT
GLOVE BIO SURGEON STRL SZ7 (GLOVE) ×3 IMPLANT
GLOVE BIO SURGEON STRL SZ7.5 (GLOVE) ×3 IMPLANT
GLOVE BIO SURGEONS STRL SZ 6.5 (GLOVE) ×1
GLOVE BIOGEL PI IND STRL 6.5 (GLOVE) ×1 IMPLANT
GLOVE BIOGEL PI IND STRL 7.0 (GLOVE) ×1 IMPLANT
GLOVE BIOGEL PI INDICATOR 6.5 (GLOVE) ×2
GLOVE BIOGEL PI INDICATOR 7.0 (GLOVE) ×2
GOWN STRL REUS W/ TWL LRG LVL3 (GOWN DISPOSABLE) ×1 IMPLANT
GOWN STRL REUS W/ TWL XL LVL3 (GOWN DISPOSABLE) ×1 IMPLANT
GOWN STRL REUS W/TWL LRG LVL3 (GOWN DISPOSABLE) ×2
GOWN STRL REUS W/TWL XL LVL3 (GOWN DISPOSABLE) ×2
KIT BASIN OR (CUSTOM PROCEDURE TRAY) ×3 IMPLANT
KIT ROOM TURNOVER OR (KITS) ×3 IMPLANT
NEEDLE 22X1 1/2 (OR ONLY) (NEEDLE) ×6 IMPLANT
NS IRRIG 1000ML POUR BTL (IV SOLUTION) ×3 IMPLANT
PAD ARMBOARD 7.5X6 YLW CONV (MISCELLANEOUS) ×3 IMPLANT
SPONGE SURGIFOAM ABS GEL 12-7 (HEMOSTASIS) IMPLANT
SUT CHROMIC 3 0 PS 2 (SUTURE) ×6 IMPLANT
TOWEL OR 17X24 6PK STRL BLUE (TOWEL DISPOSABLE) ×3 IMPLANT
TRAY ENT MC OR (CUSTOM PROCEDURE TRAY) ×3 IMPLANT
TUBING IRRIGATION (MISCELLANEOUS) ×6 IMPLANT
YANKAUER SUCT BULB TIP NO VENT (SUCTIONS) ×3 IMPLANT

## 2015-08-25 NOTE — Addendum Note (Signed)
Addendum  created 08/25/15 1319 by Jairo Benarswell Tyner Codner, MD   Modules edited: Clinical Notes   Clinical Notes:  File: [id number]

## 2015-08-25 NOTE — H&P (Signed)
Anesthesia H&P Update: History and Physical Exam reviewed; patient is OK for planned anesthetic and procedure. ? ?

## 2015-08-25 NOTE — Anesthesia Procedure Notes (Signed)
Procedure Name: Intubation Date/Time: 08/25/2015 7:37 AM Performed by: Dairl PonderJIANG, Tawnia Schirm Pre-anesthesia Checklist: Patient identified, Emergency Drugs available, Suction available, Patient being monitored and Timeout performed Patient Re-evaluated:Patient Re-evaluated prior to inductionOxygen Delivery Method: Circle system utilized Preoxygenation: Pre-oxygenation with 100% oxygen Intubation Type: IV induction Laryngoscope Size: Glidescope and 3 Grade View: Grade I Nasal Tubes: Nasal Rae Tube size: 7.0 mm Number of attempts: 1 Airway Equipment and Method: Video-laryngoscopy Placement Confirmation: positive ETCO2,  ETT inserted through vocal cords under direct vision and breath sounds checked- equal and bilateral Tube secured with: Tape Dental Injury: Teeth and Oropharynx as per pre-operative assessment

## 2015-08-25 NOTE — Transfer of Care (Signed)
Immediate Anesthesia Transfer of Care Note  Patient: Catherine Martinez  Procedure(s) Performed: Procedure(s): MULTIPLE EXTRACTIONS TEETH # 1, 2, 3, 4, 5, 6, 7, 8, 9, 10, 11, 12, 13, 14, 15, 16, 17, 18, 31, 32 AND ALVEOLOPLASTY (N/A)  Patient Location: PACU  Anesthesia Type:General  Level of Consciousness: awake, alert  and oriented  Airway & Oxygen Therapy: Patient Spontanous Breathing and Patient connected to face mask oxygen  Post-op Assessment: Report given to RN and Post -op Vital signs reviewed and stable  Post vital signs: Reviewed and stable  Last Vitals:  Filed Vitals:   08/25/15 0620  BP: 132/78  Pulse: 94  Temp: 36.8 C  Resp: 16    Complications: No apparent anesthesia complications

## 2015-08-25 NOTE — H&P (Signed)
HISTORY AND PHYSICAL  Catherine Martinez is a 29 y.o. female patient referred by general dentist for extractions.  No diagnosis found.  Past Medical History  Diagnosis Date  . HIV (human immunodeficiency virus infection) (HCC)   . History of stillbirth   . SVD (spontaneous vaginal delivery)     x 4  . Kidney infection     Recurrent hx  . Kidney infection   . Anemia     pregnant    Current Facility-Administered Medications  Medication Dose Route Frequency Provider Last Rate Last Dose  . ceFAZolin (ANCEF) IVPB 2 g/50 mL premix  2 g Intravenous To SS-Surg Ocie DoyneScott Derrius Furtick, DDS      . lidocaine-EPINEPHrine (XYLOCAINE W/EPI) 2 %-1:100000 (with pres) injection    PRN Ocie DoyneScott Cliffard Hair, DDS   20 mL at 08/25/15 0715  . oxymetazoline (AFRIN) 0.05 % nasal spray    PRN Ocie DoyneScott Austine Kelsay, DDS   1 application at 08/25/15 [phone]  . sodium chloride irrigation 0.9 %    PRN Ocie DoyneScott Lutricia Widjaja, DDS   1,000 mL at 08/25/15 0713   Allergies  Allergen Reactions  . Sulfamethoxazole-Trimethoprim Other (See Comments)    REACTION: rash   Active Problems:   * No active hospital problems. *  Vitals: Blood pressure 132/78, pulse 94, temperature 98.2 F (36.8 C), temperature source Oral, resp. rate 16, last menstrual period 07/31/2015, SpO2 100 %. Lab results:No results found for this or any previous visit (from the past 24 hour(s)). Radiology Results: No results found. General appearance: alert, cooperative, no distress and moderately obese Head: Normocephalic, without obvious abnormality, atraumatic Eyes: negative Nose: Nares normal. Septum midline. Mucosa normal. No drainage or sinus tenderness. Throat: multiple carious teeth. No trismus. Pharynx clear. Neck: no adenopathy, supple, symmetrical, trachea midline and thyroid not enlarged, symmetric, no tenderness/mass/nodules Resp: clear to auscultation bilaterally Cardio: regular rate and rhythm, S1, S2 normal, no murmur, click, rub or gallop  Assessment: Multiple  nonrestorable teeth secondary to dental caries.  Plan: Multiple dental extractions. General anesthesia. Day surgery.   Syrina Wake M 08/25/2015

## 2015-08-25 NOTE — Anesthesia Postprocedure Evaluation (Signed)
  Anesthesia Post-op Note  Patient: Catherine Martinez  Procedure(s) Performed: Procedure(s): MULTIPLE EXTRACTIONS TEETH # 1, 2, 3, 4, 5, 6, 7, 8, 9, 10, 11, 12, 13, 14, 15, 16, 17, 18, 31, 32 AND ALVEOLOPLASTY (N/A)  Patient Location: PACU  Anesthesia Type:General  Level of Consciousness: awake, alert , oriented and patient cooperative  Airway and Oxygen Therapy: Patient Spontanous Breathing  Post-op Pain: none  Post-op Assessment: Post-op Vital signs reviewed, Patient's Cardiovascular Status Stable, Respiratory Function Stable, Patent Airway, No signs of Nausea or vomiting and Pain level controlled              Post-op Vital Signs: Reviewed and stable  Last Vitals:  Filed Vitals:   08/25/15 0945  BP: 158/98  Pulse: 100  Temp:   Resp: 18    Complications: No apparent anesthesia complications

## 2015-08-25 NOTE — Progress Notes (Signed)
Called Dr.Jackson for sign out-okay to d/c home

## 2015-08-25 NOTE — Progress Notes (Signed)
Waiting for ride 

## 2015-08-25 NOTE — Op Note (Signed)
NAME:  Catherine Martinez, Catherine Martinez                 ACCOUNT NO.:  [id number]  MEDICAL RECORD NO.:  [id number]  LOCATION:  [facility]                         FACILITY:  [facility]  PHYSICIAN:  Georgia Lopes, M.D.  DATE OF BIRTH:  [birth year]  DATE OF PROCEDURE:  08/25/2015 DATE OF DISCHARGE:                              OPERATIVE REPORT   PREOPERATIVE DIAGNOSIS:  Nonrestorable teeth secondary to dental caries #1, #2, #3, #4, #5, #6, #7, #8, #9, #10, #11, #12, #13, #14, #15, #16, #17, #18, #31, #32.  POSTOPERATIVE DIAGNOSIS:  Nonrestorable teeth secondary to dental caries #1, #2, #3, #4, #5, #6, #7, #8, #9, #10, #11, #12, #13, #14, #15, #16, #17, #18, #31, #32.  PROCEDURE:  Extraction of teeth #1, #2, #3, #4, #5, #6, #7, #8, #9, #10, #11, #12, #13, #14, #15, #16, #17, #18, #31, #32, Alveoplasty right and left maxilla, and right and left mandible.  SURGEON:  Georgia Lopes, M.D.  ANESTHESIA:  General, Dr. Jean Rosenthal, attending, nasal intubation.  DESCRIPTION OF PROCEDURE:  The patient was taken to the operating room, placed on the table in a supine position.  General anesthesia was administered intravenously and a nasal endotracheal tube was placed and secured.  The eyes were protected and the patient was draped for the procedure.  Time-out was performed.  The posterior pharynx was suctioned.  A throat pack was placed.  2% lidocaine with 1:100,000 epinephrine was infiltrated in an inferior alveolar block on the right and left side and in buccal and palatal infiltration in the maxilla. Total of 20 mL was utilized.  A bite-block was placed in the right side of the mouth and a sweetheart retractor was used to retract the tongue and then a #15 blade was used to make an incision around teeth #17, #18 in the mandible and around tooth #16 carrying forward across the midline both buccally and palatally until tooth #6 was reduced.  Then, the periosteum was reflected from around these teeth with a  periosteal elevator.  The teeth were elevated with a 301 elevator and removed from the mouth with a dental forceps.  The sockets were then curetted, the gingival margins were trimmed, and then the periosteum was reflected to expose the alveolar crest.  Alveoplasty was performed in the left maxilla and mandible using the egg-shaped bur and bone file.  Then, the areas were irrigated and closed with 3-0 chromic.  A larger bite-block was then placed in the left side of the mouth and a sweetheart was repositioned to this side and then attention was turned to the right maxilla and mandible.  A 15 blade was used to make an incision around teeth #31 and #32 in the mandible and around teeth #1 through #6 in the maxilla on the buccal and palatal aspects.  The periosteum was reflected and then a 301 elevator was used to elevate the teeth.  The teeth were removed with the dental forceps.  The sockets were curetted and irrigated.  The gingival margins were trimmed and then the periosteum was reflected to expose the alveolar crest.  Egg-shaped bur and bone file were used to perform alveoplasty in the right maxilla and mandible. Then,  the areas were irrigated and closed with 3-0 chromic.  The oral cavity was inspected, found to have good contour, hemostasis, and closure.  The oral cavity was irrigated and suctioned with Yankauer suction.  The throat pack was removed.  The patient was awakened and taken to the recovery room, breathing spontaneously in good condition.  ESTIMATED BLOOD LOSS:  Minimal.  COMPLICATIONS:  None.  SPECIMENS:  None.     Georgia LopesScott M. Derrall Hicks, M.D.     SMJ/MEDQ  D:  08/25/2015  T:  08/25/2015  Job:  [id number]

## 2015-08-25 NOTE — Op Note (Signed)
08/25/2015  8:16 AM  PATIENT:  Catherine Martinez  29 y.o. female  PRE-OPERATIVE DIAGNOSIS:  NON RESTORABLE TEETH #  1, 2, 3, 4, 5, 6, 7, 8, 9, 10, 11, 12, 13, 14, 15, 16, 17, 18, 31, 32  POST-OPERATIVE DIAGNOSIS:  SAME  PROCEDURE:  Procedure(s): MULTIPLE EXTRACTIONS TEETH # 1, 2, 3, 4, 5, 6, 7, 8, 9, 10, 11, 12, 13, 14, 15, 16, 17, 18, 31, 32 AND ALVEOLOPLASTY  SURGEON:  Surgeon(s): Ocie DoyneScott Zykerria Tanton, DDS  ANESTHESIA:   local and general  EBL:  minimal  DRAINS: none   SPECIMEN:  No Specimen  COUNTS:  YES  PLAN OF CARE: Discharge to home after PACU  PATIENT DISPOSITION:  PACU - hemodynamically stable.   PROCEDURE DETAILS: Dictation # [id number]  Catherine Martinez, DMD 08/25/2015 8:16 AM

## 2015-08-25 NOTE — Anesthesia Preprocedure Evaluation (Addendum)
Anesthesia Evaluation  Patient identified by MRN, date of birth, ID band Patient awake    Reviewed: Allergy & Precautions, NPO status , Patient's Chart, lab work & pertinent test results  History of Anesthesia Complications (+) DIFFICULT IV STICK / SPECIAL LINE  Airway Mallampati: II  TM Distance: >3 FB Neck ROM: Full    Dental  (+) Poor Dentition, Loose   Pulmonary former smoker (quit 2011),    breath sounds clear to auscultation       Cardiovascular negative cardio ROS   Rhythm:Regular Rate:Normal     Neuro/Psych negative neurological ROS     GI/Hepatic negative GI ROS, Neg liver ROS,   Endo/Other  Morbid obesity  Renal/GU negative Renal ROS     Musculoskeletal   Abdominal (+) + obese,   Peds  Hematology  (+) HIV,   Anesthesia Other Findings   Reproductive/Obstetrics 08/23/15: preg test NEG                          Anesthesia Physical Anesthesia Plan  ASA: III  Anesthesia Plan: General   Post-op Pain Management:    Induction: Intravenous  Airway Management Planned: Nasal ETT  Additional Equipment:   Intra-op Plan:   Post-operative Plan: Extubation in OR  Informed Consent: I have reviewed the patients History and Physical, chart, labs and discussed the procedure including the risks, benefits and alternatives for the proposed anesthesia with the patient or authorized representative who has indicated his/her understanding and acceptance.   Dental advisory given  Plan Discussed with: Anesthesiologist, CRNA and Surgeon  Anesthesia Plan Comments: (Plan routine monitors, GETA )       Anesthesia Quick Evaluation

## 2015-08-28 ENCOUNTER — Encounter (HOSPITAL_COMMUNITY): Payer: Self-pay | Admitting: Oral Surgery

## 2019-08-07 ENCOUNTER — Encounter (HOSPITAL_COMMUNITY): Payer: Self-pay | Admitting: Emergency Medicine

## 2019-08-07 ENCOUNTER — Emergency Department (HOSPITAL_COMMUNITY)
Admission: EM | Admit: 2019-08-07 | Discharge: 2019-08-07 | Disposition: A | Discharge status: Home / Self Care | Payer: Self-pay | Attending: Emergency Medicine | Admitting: Emergency Medicine

## 2019-08-07 ENCOUNTER — Other Ambulatory Visit: Payer: Self-pay

## 2019-08-07 DIAGNOSIS — Z87891 Personal history of nicotine dependence: Secondary | ICD-10-CM | POA: Insufficient documentation

## 2019-08-07 DIAGNOSIS — B029 Zoster without complications: Secondary | ICD-10-CM | POA: Insufficient documentation

## 2019-08-07 DIAGNOSIS — Z79899 Other long term (current) drug therapy: Secondary | ICD-10-CM | POA: Insufficient documentation

## 2019-08-07 DIAGNOSIS — Z21 Asymptomatic human immunodeficiency virus [HIV] infection status: Secondary | ICD-10-CM | POA: Insufficient documentation

## 2019-08-07 MED ORDER — PREDNISONE 10 MG (21) PO TBPK: ORAL_TABLET | ORAL | 0 refills | Status: AC

## 2019-08-07 MED ORDER — IBUPROFEN 600 MG PO TABS: 600.0000 mg | ORAL_TABLET | Freq: Three times a day (TID) | ORAL | 0 refills | Status: DC | PRN

## 2019-08-07 MED ORDER — VALACYCLOVIR HCL 1 G PO TABS: 1000.0000 mg | ORAL_TABLET | Freq: Three times a day (TID) | ORAL | 0 refills | Status: DC

## 2019-08-07 NOTE — ED Triage Notes (Signed)
Patient here from home with complaints of shingles to left arm, painful. Reports hx of same.

## 2019-08-07 NOTE — ED Provider Notes (Signed)
[facility]-EMERGENCY DEPT Provider Note   CSN: [id number] Arrival date & time: 08/07/19  [id number]     History   Chief Complaint Chief Complaint  Patient presents with  . Herpes Zoster    HPI Catherine Martinez is a 33 y.o. female.     HPI Patient presents emergency room for evaluation of a rash in her left arm.  Patient started noticing symptoms yesterday.  They have progressed since then and she has noticed increasing rash associated with increasing pain.  She denies any fevers or chills.  She is not having any chest pain or shortness of breath.  Patient has a history of having shingles in the past and this feels very similar. Past Medical History:  Diagnosis Date  . Anemia    pregnant  . History of stillbirth   . HIV (human immunodeficiency virus infection) (HCC)   . Kidney infection    Recurrent hx  . Kidney infection   . SVD (spontaneous vaginal delivery)    x 4    Patient Active Problem List   Diagnosis Date Noted  . Late prenatal care complicating pregnancy 12/14/2012  . Previous stillbirth or demise, antepartum 12/14/2012  . CHLAMYDIAL INFECTION, HX OF 04/12/2010  . HIV INFECTION 03/29/2010    Past Surgical History:  Procedure Laterality Date  . CESAREAN SECTION WITH BILATERAL TUBAL LIGATION N/A 12/28/2012   Procedure: CESAREAN SECTION WITH BILATERAL TUBAL LIGATION;  Surgeon: Allie BossierMyra C Dove, MD;  Location: [facility];  Service: Obstetrics;  Laterality: N/A;  AZT prior to section  . TOOTH EXTRACTION N/A 08/25/2015   Procedure: MULTIPLE EXTRACTIONS TEETH # 1, 2, 3, 4, 5, 6, 7, 8, 9, 10, 11, 12, 13, 14, 15, 16, 17, 18, 31, 32 AND ALVEOLOPLASTY;  Surgeon: Ocie DoyneScott Jensen, DDS;  Location: [facility] OR;  Service: Oral Surgery;  Laterality: N/A;     OB History    Gravida  5   Para  5   Term  5   Preterm      AB      Living  4     SAB      TAB      Ectopic      Multiple      Live Births  1            Home Medications    Prior to Admission  medications   Medication Sig Start Date End Date Taking? Authorizing Provider  acyclovir (ZOVIRAX) 400 MG tablet Take 2 tablets (800 mg total) by mouth 5 (five) times daily. X 5 days Patient taking differently: Take 800 mg by mouth as needed (five times daily for 5 days as needed). X 5 days 03/25/13   West, Emily, PA-C  amoxicillin (AMOXIL) 500 MG capsule Take 1 capsule (500 mg total) by mouth 3 (three) times daily. 08/25/15   Ocie DoyneJensen, Scott, DDS  Dolutegravir Sodium (TIVICAY) 50 MG TABS Take 1 tablet (50 mg total) by mouth daily. 02/03/13   Randall HissVan Dam, Cornelius N, MD  emtricitabine-tenofovir (TRUVADA) 200-300 MG per tablet Take 1 tablet by mouth daily. 02/03/13   Randall HissVan Dam, Cornelius N, MD  ibuprofen (ADVIL) 600 MG tablet Take 1 tablet (600 mg total) by mouth every 8 (eight) hours as needed. 08/07/19   Linwood DibblesKnapp, Anie Juniel, MD  oxyCODONE-acetaminophen (PERCOCET) 5-325 MG tablet Take 1-2 tablets by mouth every 4 (four) hours as needed for severe pain. 08/25/15   Ocie DoyneJensen, Scott, DDS  predniSONE (STERAPRED UNI-PAK 21 TAB) 10 MG (21) TBPK tablet  Take 6 tablets (60 mg total) by mouth daily for 2 days, THEN 5 tablets (50 mg total) daily for 2 days, THEN 4 tablets (40 mg total) daily for 2 days, THEN 3 tablets (30 mg total) daily for 2 days, THEN 2 tablets (20 mg total) daily for 2 days, THEN 1 tablet (10 mg total) daily for 2 days. 08/07/19 08/19/19  Linwood Dibbles, MD  valACYclovir (VALTREX) 1000 MG tablet Take 1 tablet (1,000 mg total) by mouth 3 (three) times daily. 08/07/19   Linwood Dibbles, MD    Family History Family History  Problem Relation Age of Onset  . Diabetes Mother   . Diabetes Maternal Aunt   . Diabetes Maternal Grandfather   . Hyperlipidemia Maternal Grandfather   . Other Neg Hx     Social History Social History   Tobacco Use  . Smoking status: Former Smoker    Packs/day: 1.00    Years: 1.00    Pack years: 1.00    Types: Cigarettes    Quit date: 10/21/2009    Years since quitting: 9.8  . Smokeless  tobacco: Never Used  Substance Use Topics  . Alcohol use: No  . Drug use: No     Allergies   Sulfamethoxazole-trimethoprim   Review of Systems Review of Systems  All other systems reviewed and are negative.    Physical Exam Updated Vital Signs BP (!) 130/99 (BP Location: Right Arm)   Pulse 76   Temp 98.8 F (37.1 C) (Oral)   Resp 19   SpO2 100%   Physical Exam Vitals signs and nursing note reviewed.  Constitutional:      General: She is not in acute distress.    Appearance: She is well-developed.  HENT:     Head: Normocephalic and atraumatic.     Right Ear: External ear normal.     Left Ear: External ear normal.  Eyes:     General: No scleral icterus.       Right eye: No discharge.        Left eye: No discharge.     Conjunctiva/sclera: Conjunctivae normal.  Neck:     Musculoskeletal: Neck supple.     Trachea: No tracheal deviation.  Cardiovascular:     Rate and Rhythm: Normal rate.  Pulmonary:     Effort: Pulmonary effort is normal. No respiratory distress.     Breath sounds: No stridor.  Abdominal:     General: There is no distension.  Musculoskeletal:        General: No swelling or deformity.  Skin:    General: Skin is warm and dry.     Findings: Rash present. Rash is vesicular.     Comments: Vesicular rash left arm with some surrounding erythema  Neurological:     Mental Status: She is alert.     Cranial Nerves: Cranial nerve deficit: no gross deficits.      ED Treatments / Results   Procedures Procedures (including critical care time)  Medications Ordered in ED Medications - No data to display   Initial Impression / Assessment and Plan / ED Course  I have reviewed the triage vital signs and the nursing notes.  Pertinent labs & imaging results that were available during my care of the patient were reviewed by me and considered in my medical decision making (see chart for details).   Patient's exam is consistent with a herpes zoster rash.   She is not having any other systemic complaints.  Plan on discharge  home with a prescription for Valtrex as well as prednisone and ibuprofen for pain.  Outpatient follow up with PCP  Final Clinical Impressions(s) / ED Diagnoses   Final diagnoses:  Herpes zoster without complication    ED Discharge Orders         Ordered    valACYclovir (VALTREX) 1000 MG tablet  3 times daily     08/07/19 1026    predniSONE (STERAPRED UNI-PAK 21 TAB) 10 MG (21) TBPK tablet     08/07/19 1028    ibuprofen (ADVIL) 600 MG tablet  Every 8 hours PRN     08/07/19 1028           Dorie Rank, MD 08/07/19 1031

## 2020-06-06 ENCOUNTER — Encounter (HOSPITAL_COMMUNITY): Payer: Self-pay | Admitting: Emergency Medicine

## 2020-06-06 ENCOUNTER — Other Ambulatory Visit: Payer: Self-pay

## 2020-06-06 ENCOUNTER — Emergency Department (HOSPITAL_COMMUNITY)
Admission: EM | Admit: 2020-06-06 | Discharge: 2020-06-07 | Disposition: A | Discharge status: Home / Self Care | Payer: Medicaid Other | Attending: Emergency Medicine | Admitting: Emergency Medicine

## 2020-06-06 DIAGNOSIS — Z5321 Procedure and treatment not carried out due to patient leaving prior to being seen by health care provider: Secondary | ICD-10-CM | POA: Insufficient documentation

## 2020-06-06 DIAGNOSIS — N898 Other specified noninflammatory disorders of vagina: Secondary | ICD-10-CM | POA: Insufficient documentation

## 2020-06-06 NOTE — ED Triage Notes (Signed)
States she noticed an odor today at work when she went to bathroom. Noted to become worse later today. Denies discharge or other problems.

## 2020-06-07 NOTE — ED Notes (Signed)
Called pt x3 for recheck of vitals and no response  

## 2020-06-11 ENCOUNTER — Encounter (HOSPITAL_COMMUNITY): Payer: Self-pay | Admitting: Emergency Medicine

## 2020-06-11 ENCOUNTER — Inpatient Hospital Stay (HOSPITAL_COMMUNITY)
Admission: EM | Admit: 2020-06-11 | Discharge: 2020-06-17 | DRG: 546 | Disposition: A | Discharge status: Home / Self Care | Payer: Medicaid Other | Attending: Internal Medicine | Admitting: Internal Medicine

## 2020-06-11 ENCOUNTER — Other Ambulatory Visit: Payer: Self-pay

## 2020-06-11 DIAGNOSIS — Z882 Allergy status to sulfonamides status: Secondary | ICD-10-CM

## 2020-06-11 DIAGNOSIS — M311 Thrombotic microangiopathy: Principal | ICD-10-CM | POA: Diagnosis present

## 2020-06-11 DIAGNOSIS — R778 Other specified abnormalities of plasma proteins: Secondary | ICD-10-CM | POA: Diagnosis present

## 2020-06-11 DIAGNOSIS — A599 Trichomoniasis, unspecified: Secondary | ICD-10-CM | POA: Diagnosis present

## 2020-06-11 DIAGNOSIS — D509 Iron deficiency anemia, unspecified: Secondary | ICD-10-CM | POA: Diagnosis present

## 2020-06-11 DIAGNOSIS — Z79899 Other long term (current) drug therapy: Secondary | ICD-10-CM

## 2020-06-11 DIAGNOSIS — N189 Chronic kidney disease, unspecified: Secondary | ICD-10-CM | POA: Diagnosis present

## 2020-06-11 DIAGNOSIS — N179 Acute kidney failure, unspecified: Secondary | ICD-10-CM | POA: Diagnosis present

## 2020-06-11 DIAGNOSIS — R7401 Elevation of levels of liver transaminase levels: Secondary | ICD-10-CM | POA: Diagnosis present

## 2020-06-11 DIAGNOSIS — Z6832 Body mass index (BMI) 32.0-32.9, adult: Secondary | ICD-10-CM

## 2020-06-11 DIAGNOSIS — M3119 Other thrombotic microangiopathy: Secondary | ICD-10-CM

## 2020-06-11 DIAGNOSIS — B20 Human immunodeficiency virus [HIV] disease: Secondary | ICD-10-CM | POA: Diagnosis present

## 2020-06-11 DIAGNOSIS — Z20822 Contact with and (suspected) exposure to covid-19: Secondary | ICD-10-CM | POA: Diagnosis present

## 2020-06-11 DIAGNOSIS — Z833 Family history of diabetes mellitus: Secondary | ICD-10-CM

## 2020-06-11 DIAGNOSIS — N390 Urinary tract infection, site not specified: Secondary | ICD-10-CM | POA: Diagnosis present

## 2020-06-11 DIAGNOSIS — R17 Unspecified jaundice: Secondary | ICD-10-CM | POA: Diagnosis present

## 2020-06-11 DIAGNOSIS — E669 Obesity, unspecified: Secondary | ICD-10-CM | POA: Diagnosis present

## 2020-06-11 DIAGNOSIS — Z21 Asymptomatic human immunodeficiency virus [HIV] infection status: Secondary | ICD-10-CM | POA: Diagnosis present

## 2020-06-11 DIAGNOSIS — Z83438 Family history of other disorder of lipoprotein metabolism and other lipidemia: Secondary | ICD-10-CM

## 2020-06-11 DIAGNOSIS — Z87891 Personal history of nicotine dependence: Secondary | ICD-10-CM

## 2020-06-11 LAB — CBC WITH DIFFERENTIAL/PLATELET
Abs Immature Granulocytes: 0.22 10*3/uL — ABNORMAL HIGH (ref 0.00–0.07)
Basophils Absolute: 0 10*3/uL (ref 0.0–0.1)
Basophils Relative: 0 %
Eosinophils Absolute: 0 10*3/uL (ref 0.0–0.5)
Eosinophils Relative: 1 %
HCT: 25.7 % — ABNORMAL LOW (ref 36.0–46.0)
Hemoglobin: 8.2 g/dL — ABNORMAL LOW (ref 12.0–15.0)
Immature Granulocytes: 4 %
Lymphocytes Relative: 33 %
Lymphs Abs: 1.8 10*3/uL (ref 0.7–4.0)
MCH: 23.7 pg — ABNORMAL LOW (ref 26.0–34.0)
MCHC: 31.9 g/dL (ref 30.0–36.0)
MCV: 74.3 fL — ABNORMAL LOW (ref 80.0–100.0)
Monocytes Absolute: 0.5 10*3/uL (ref 0.1–1.0)
Monocytes Relative: 9 %
Neutro Abs: 2.9 10*3/uL (ref 1.7–7.7)
Neutrophils Relative %: 53 %
Platelets: 6 10*3/uL — CL (ref 150–400)
RBC: 3.46 MIL/uL — ABNORMAL LOW (ref 3.87–5.11)
RDW: 19.1 % — ABNORMAL HIGH (ref 11.5–15.5)
WBC: 5.4 10*3/uL (ref 4.0–10.5)
nRBC: 0 % (ref 0.0–0.2)

## 2020-06-11 LAB — BASIC METABOLIC PANEL
Anion gap: 9 (ref 5–15)
BUN: 20 mg/dL (ref 6–20)
CO2: 21 mmol/L — ABNORMAL LOW (ref 22–32)
Calcium: 8.6 mg/dL — ABNORMAL LOW (ref 8.9–10.3)
Chloride: 105 mmol/L (ref 98–111)
Creatinine, Ser: 1.19 mg/dL — ABNORMAL HIGH (ref 0.44–1.00)
GFR calc Af Amer: >60 mL/min (ref >60)
GFR calc non Af Amer: 59 mL/min — ABNORMAL LOW (ref >60)
Glucose, Bld: 115 mg/dL — ABNORMAL HIGH (ref 70–99)
Potassium: 3.9 mmol/L (ref 3.5–5.1)
Sodium: 135 mmol/L (ref 135–145)

## 2020-06-11 LAB — CBC
HCT: 25.7 % — ABNORMAL LOW (ref 36.0–46.0)
Hemoglobin: 8.2 g/dL — ABNORMAL LOW (ref 12.0–15.0)
MCH: 23.6 pg — ABNORMAL LOW (ref 26.0–34.0)
MCHC: 31.9 g/dL (ref 30.0–36.0)
MCV: 74.1 fL — ABNORMAL LOW (ref 80.0–100.0)
Platelets: 6 10*3/uL — CL (ref 150–400)
RBC: 3.47 MIL/uL — ABNORMAL LOW (ref 3.87–5.11)
RDW: 19.1 % — ABNORMAL HIGH (ref 11.5–15.5)
WBC: 5.1 10*3/uL (ref 4.0–10.5)
nRBC: 0 % (ref 0.0–0.2)

## 2020-06-11 LAB — I-STAT BETA HCG BLOOD, ED (MC, WL, AP ONLY): I-stat hCG, quantitative: <5 m[IU]/mL (ref <5)

## 2020-06-11 MED ORDER — SODIUM CHLORIDE 0.9% FLUSH: 3.0000 mL | Freq: Once | INTRAVENOUS | Status: DC

## 2020-06-11 NOTE — ED Notes (Signed)
Dr. Fredderick Phenix notified of critical Platelet count.  CBC being repeated.

## 2020-06-11 NOTE — ED Triage Notes (Signed)
Pt to triage via [facility].  Reports feeling lightheaded today.  Headache yesterday that resolved.  Pt started on Cipro on Wednesday for kidney infection.  Denies pain.  20g LAC.

## 2020-06-12 ENCOUNTER — Emergency Department (HOSPITAL_COMMUNITY): Payer: Medicaid Other

## 2020-06-12 ENCOUNTER — Inpatient Hospital Stay (HOSPITAL_COMMUNITY): Payer: Medicaid Other

## 2020-06-12 DIAGNOSIS — R7401 Elevation of levels of liver transaminase levels: Secondary | ICD-10-CM | POA: Diagnosis present

## 2020-06-12 DIAGNOSIS — D696 Thrombocytopenia, unspecified: Secondary | ICD-10-CM

## 2020-06-12 DIAGNOSIS — Z20822 Contact with and (suspected) exposure to covid-19: Secondary | ICD-10-CM | POA: Diagnosis present

## 2020-06-12 DIAGNOSIS — Z79899 Other long term (current) drug therapy: Secondary | ICD-10-CM | POA: Diagnosis not present

## 2020-06-12 DIAGNOSIS — R778 Other specified abnormalities of plasma proteins: Secondary | ICD-10-CM

## 2020-06-12 DIAGNOSIS — Z87891 Personal history of nicotine dependence: Secondary | ICD-10-CM

## 2020-06-12 DIAGNOSIS — A599 Trichomoniasis, unspecified: Secondary | ICD-10-CM

## 2020-06-12 DIAGNOSIS — M311 Thrombotic microangiopathy: Secondary | ICD-10-CM | POA: Diagnosis present

## 2020-06-12 DIAGNOSIS — B20 Human immunodeficiency virus [HIV] disease: Secondary | ICD-10-CM

## 2020-06-12 DIAGNOSIS — D594 Other nonautoimmune hemolytic anemias: Secondary | ICD-10-CM

## 2020-06-12 DIAGNOSIS — R17 Unspecified jaundice: Secondary | ICD-10-CM | POA: Diagnosis present

## 2020-06-12 DIAGNOSIS — N189 Chronic kidney disease, unspecified: Secondary | ICD-10-CM | POA: Diagnosis present

## 2020-06-12 DIAGNOSIS — Z83438 Family history of other disorder of lipoprotein metabolism and other lipidemia: Secondary | ICD-10-CM | POA: Diagnosis not present

## 2020-06-12 DIAGNOSIS — Z21 Asymptomatic human immunodeficiency virus [HIV] infection status: Secondary | ICD-10-CM | POA: Diagnosis present

## 2020-06-12 DIAGNOSIS — D509 Iron deficiency anemia, unspecified: Secondary | ICD-10-CM

## 2020-06-12 DIAGNOSIS — E669 Obesity, unspecified: Secondary | ICD-10-CM | POA: Diagnosis present

## 2020-06-12 DIAGNOSIS — Z833 Family history of diabetes mellitus: Secondary | ICD-10-CM | POA: Diagnosis not present

## 2020-06-12 DIAGNOSIS — Z882 Allergy status to sulfonamides status: Secondary | ICD-10-CM | POA: Diagnosis not present

## 2020-06-12 DIAGNOSIS — N179 Acute kidney failure, unspecified: Secondary | ICD-10-CM | POA: Diagnosis present

## 2020-06-12 DIAGNOSIS — N390 Urinary tract infection, site not specified: Secondary | ICD-10-CM

## 2020-06-12 DIAGNOSIS — Z6832 Body mass index (BMI) 32.0-32.9, adult: Secondary | ICD-10-CM | POA: Diagnosis not present

## 2020-06-12 DIAGNOSIS — M3119 Other thrombotic microangiopathy: Secondary | ICD-10-CM | POA: Diagnosis present

## 2020-06-12 HISTORY — PX: IR FLUORO GUIDE CV LINE RIGHT: IMG2283

## 2020-06-12 HISTORY — PX: IR US GUIDE VASC ACCESS RIGHT: IMG2390

## 2020-06-12 LAB — RETICULOCYTES
Immature Retic Fract: 24.7 % — ABNORMAL HIGH (ref 2.3–15.9)
RBC.: 3.1 MIL/uL — ABNORMAL LOW (ref 3.87–5.11)
Retic Count, Absolute: 88 10*3/uL (ref 19.0–186.0)
Retic Ct Pct: 2.8 % (ref 0.4–3.1)

## 2020-06-12 LAB — URINALYSIS, ROUTINE W REFLEX MICROSCOPIC
RBC / HPF: >50 RBC/hpf — ABNORMAL HIGH (ref 0–5)
Squamous Epithelial / HPF: >50 — ABNORMAL HIGH (ref 0–5)
WBC, UA: >50 WBC/hpf — ABNORMAL HIGH (ref 0–5)

## 2020-06-12 LAB — SARS CORONAVIRUS 2 BY RT PCR (HOSPITAL ORDER, PERFORMED IN ~~LOC~~ HOSPITAL LAB): SARS Coronavirus 2: NEGATIVE

## 2020-06-12 LAB — SAVE SMEAR(SSMR), FOR PROVIDER SLIDE REVIEW

## 2020-06-12 LAB — HEPATIC FUNCTION PANEL
ALT: 44 U/L (ref 0–44)
AST: 76 U/L — ABNORMAL HIGH (ref 15–41)
Albumin: 3.4 g/dL — ABNORMAL LOW (ref 3.5–5.0)
Alkaline Phosphatase: 54 U/L (ref 38–126)
Bilirubin, Direct: 1.2 mg/dL — ABNORMAL HIGH (ref 0.0–0.2)
Indirect Bilirubin: 6.1 mg/dL — ABNORMAL HIGH (ref 0.3–0.9)
Total Bilirubin: 7.3 mg/dL — ABNORMAL HIGH (ref 0.3–1.2)
Total Protein: 8.7 g/dL — ABNORMAL HIGH (ref 6.5–8.1)

## 2020-06-12 LAB — TROPONIN I (HIGH SENSITIVITY)
Troponin I (High Sensitivity): 201 ng/L (ref <18)
Troponin I (High Sensitivity): 192 ng/L (ref <18)

## 2020-06-12 LAB — BASIC METABOLIC PANEL
Anion gap: 12 (ref 5–15)
BUN: 20 mg/dL (ref 6–20)
CO2: 19 mmol/L — ABNORMAL LOW (ref 22–32)
Calcium: 8.7 mg/dL — ABNORMAL LOW (ref 8.9–10.3)
Chloride: 104 mmol/L (ref 98–111)
Creatinine, Ser: 1.13 mg/dL — ABNORMAL HIGH (ref 0.44–1.00)
GFR calc Af Amer: >60 mL/min (ref >60)
GFR calc non Af Amer: >60 mL/min (ref >60)
Glucose, Bld: 103 mg/dL — ABNORMAL HIGH (ref 70–99)
Potassium: 3.7 mmol/L (ref 3.5–5.1)
Sodium: 135 mmol/L (ref 135–145)

## 2020-06-12 LAB — IRON AND TIBC
Iron: 154 ug/dL (ref 28–170)
Saturation Ratios: 51 % — ABNORMAL HIGH (ref 10.4–31.8)
TIBC: 304 ug/dL (ref 250–450)
UIBC: 150 ug/dL

## 2020-06-12 LAB — VITAMIN B12: Vitamin B-12: 479 pg/mL (ref 180–914)

## 2020-06-12 LAB — LACTATE DEHYDROGENASE: LDH: 1410 U/L — ABNORMAL HIGH (ref 98–192)

## 2020-06-12 LAB — DIC (DISSEMINATED INTRAVASCULAR COAGULATION)PANEL
D-Dimer, Quant: 13.81 ug/mL-FEU — ABNORMAL HIGH (ref 0.00–0.50)
Fibrinogen: 389 mg/dL (ref 210–475)
INR: 1.3 — ABNORMAL HIGH (ref 0.8–1.2)
Platelets: 6 10*3/uL — CL (ref 150–400)
Prothrombin Time: 15.4 seconds — ABNORMAL HIGH (ref 11.4–15.2)
Smear Review: NONE SEEN
aPTT: 35 seconds (ref 24–36)

## 2020-06-12 LAB — PREPARE RBC (CROSSMATCH)

## 2020-06-12 LAB — FOLATE: Folate: 13.8 ng/mL (ref >5.9)

## 2020-06-12 LAB — FERRITIN: Ferritin: 910 ng/mL — ABNORMAL HIGH (ref 11–307)

## 2020-06-12 MED ORDER — PREDNISONE 20 MG PO TABS
80.0000 mg | ORAL_TABLET | Freq: Every day | ORAL | Status: DC
  Administered 2020-06-12 – 2020-06-16 (×5): 80 mg via ORAL
  Filled 2020-06-12 (×5): qty 4

## 2020-06-12 MED ORDER — ONDANSETRON HCL 4 MG/2ML IJ SOLN: 4.0000 mg | Freq: Four times a day (QID) | INTRAMUSCULAR | Status: DC | PRN

## 2020-06-12 MED ORDER — LIDOCAINE HCL (PF) 1 % IJ SOLN
INTRAMUSCULAR | Status: AC | PRN
  Administered 2020-06-12: 10 mL

## 2020-06-12 MED ORDER — DOLUTEGRAVIR SODIUM 50 MG PO TABS
50.0000 mg | ORAL_TABLET | Freq: Every day | ORAL | Status: DC
  Administered 2020-06-13 – 2020-06-17 (×5): 50 mg via ORAL
  Filled 2020-06-12 (×6): qty 1

## 2020-06-12 MED ORDER — CHLORHEXIDINE GLUCONATE CLOTH 2 % EX PADS
6.0000 | MEDICATED_PAD | Freq: Every day | CUTANEOUS | Status: DC
  Administered 2020-06-12 – 2020-06-17 (×6): 6 via TOPICAL

## 2020-06-12 MED ORDER — SODIUM CHLORIDE 0.9 % IV SOLN
1.0000 g | INTRAVENOUS | Status: AC
  Administered 2020-06-12 – 2020-06-14 (×3): 1 g via INTRAVENOUS
  Filled 2020-06-12 (×3): qty 10

## 2020-06-12 MED ORDER — SODIUM CHLORIDE 0.9% FLUSH
3.0000 mL | Freq: Two times a day (BID) | INTRAVENOUS | Status: DC
  Administered 2020-06-12 – 2020-06-17 (×8): 3 mL via INTRAVENOUS

## 2020-06-12 MED ORDER — ALBUTEROL SULFATE (2.5 MG/3ML) 0.083% IN NEBU
2.5000 mg | INHALATION_SOLUTION | Freq: Four times a day (QID) | RESPIRATORY_TRACT | Status: DC
  Administered 2020-06-12: 2.5 mg via RESPIRATORY_TRACT
  Filled 2020-06-12: qty 3

## 2020-06-12 MED ORDER — SODIUM CHLORIDE 0.9% IV SOLUTION: Freq: Once | INTRAVENOUS | Status: AC

## 2020-06-12 MED ORDER — LIDOCAINE HCL 1 % IJ SOLN
INTRAMUSCULAR | Status: AC
  Filled 2020-06-12: qty 20

## 2020-06-12 MED ORDER — SODIUM CHLORIDE 0.9 % IV SOLN
3.0000 g | Freq: Once | INTRAVENOUS | Status: AC
  Administered 2020-06-12: 3 g via INTRAVENOUS
  Filled 2020-06-12: qty 30

## 2020-06-12 MED ORDER — SODIUM CHLORIDE 0.9 % IV SOLN: Freq: Once | INTRAVENOUS | Status: AC

## 2020-06-12 MED ORDER — EMTRICITABINE-TENOFOVIR AF 200-25 MG PO TABS
1.0000 | ORAL_TABLET | Freq: Every day | ORAL | Status: DC
  Administered 2020-06-13 – 2020-06-17 (×5): 1 via ORAL
  Filled 2020-06-12 (×6): qty 1

## 2020-06-12 MED ORDER — HEPARIN SODIUM (PORCINE) 1000 UNIT/ML IJ SOLN
INTRAMUSCULAR | Status: AC
  Administered 2020-06-12: 2.6 mL
  Filled 2020-06-12: qty 1

## 2020-06-12 MED ORDER — DOLUTEGRAVIR SODIUM 50 MG PO TABS
50.0000 mg | ORAL_TABLET | Freq: Every day | ORAL | Status: DC
  Filled 2020-06-12: qty 1

## 2020-06-12 MED ORDER — ACETAMINOPHEN 650 MG RE SUPP: 650.0000 mg | Freq: Four times a day (QID) | RECTAL | Status: DC | PRN

## 2020-06-12 MED ORDER — ACETAMINOPHEN 325 MG PO TABS: 650.0000 mg | ORAL_TABLET | ORAL | Status: DC | PRN

## 2020-06-12 MED ORDER — ANTICOAGULANT SODIUM CITRATE 4% (200MG/5ML) IV SOLN
5.0000 mL | Freq: Once | Status: AC
  Administered 2020-06-12: 5 mL
  Filled 2020-06-12: qty 5

## 2020-06-12 MED ORDER — ALBUTEROL SULFATE (2.5 MG/3ML) 0.083% IN NEBU: 2.5000 mg | INHALATION_SOLUTION | Freq: Four times a day (QID) | RESPIRATORY_TRACT | Status: DC | PRN

## 2020-06-12 MED ORDER — METRONIDAZOLE IN NACL 5-0.79 MG/ML-% IV SOLN
500.0000 mg | Freq: Two times a day (BID) | INTRAVENOUS | Status: DC
  Administered 2020-06-12 – 2020-06-15 (×7): 500 mg via INTRAVENOUS
  Filled 2020-06-12 (×7): qty 100

## 2020-06-12 MED ORDER — CALCIUM CARBONATE ANTACID 500 MG PO CHEW
CHEWABLE_TABLET | ORAL | Status: AC
  Administered 2020-06-12: 400 mg
  Filled 2020-06-12: qty 2

## 2020-06-12 MED ORDER — ACD FORMULA A 0.73-2.45-2.2 GM/100ML VI SOLN
1000.0000 mL | Status: DC
  Administered 2020-06-12: 1000 mL
  Filled 2020-06-12 (×2): qty 1000

## 2020-06-12 MED ORDER — CALCIUM CARBONATE ANTACID 500 MG PO CHEW: 2.0000 | CHEWABLE_TABLET | ORAL | Status: AC

## 2020-06-12 MED ORDER — ONDANSETRON HCL 4 MG PO TABS: 4.0000 mg | ORAL_TABLET | Freq: Four times a day (QID) | ORAL | Status: DC | PRN

## 2020-06-12 MED ORDER — ACD FORMULA A 0.73-2.45-2.2 GM/100ML VI SOLN
Status: AC
  Filled 2020-06-12: qty 500

## 2020-06-12 MED ORDER — DIPHENHYDRAMINE HCL 25 MG PO CAPS
25.0000 mg | ORAL_CAPSULE | Freq: Four times a day (QID) | ORAL | Status: DC | PRN
  Administered 2020-06-12 – 2020-06-13 (×2): 25 mg via ORAL
  Filled 2020-06-12 (×2): qty 1

## 2020-06-12 MED ORDER — ACETAMINOPHEN 325 MG PO TABS
650.0000 mg | ORAL_TABLET | Freq: Four times a day (QID) | ORAL | Status: DC | PRN
  Administered 2020-06-13 – 2020-06-15 (×2): 650 mg via ORAL
  Filled 2020-06-12 (×4): qty 2

## 2020-06-12 NOTE — Progress Notes (Addendum)
Brief hematology note:  [name] remains in the emergency room.  She is awaiting bed placement.  Underwent placement of HD catheter earlier today.  Tolerated procedure well.  Reports some mild discomfort around insertion site.  Dressing to this area is dry.  No bleeding has been reported.  She anticipates going for plasmapheresis shortly.  Hematology will follow up with the patient tomorrow.  Clenton Pare, DNP, AGPCNP-BC, AOCNP Mon/Tues/Thurs/Fri 7am-5pm; Off Wednesdays Cell: [phone]

## 2020-06-12 NOTE — Consult Note (Signed)
   Patient Status: [facility] - In-pt  Assessment and Plan: Patient in need of venous access. She will be seen in IR today for an image-guided temporary dialysis catheter for plasmapheresis, with plans to convert to a tunneled dialysis catheter at a later date.   ______________________________________________________________________   History of Present Illness: Catherine Martinez is a 34 y.o. female with a medical history that includes anemia and HIV. She presented to the ED 06/11/20 with dizziness, found to be thrombocytopenic. 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 She will undergo plasmapheresis in the HD unit as soon as her temporary catheter is in place.   Allergies and medications reviewed.   Vital Signs: BP 131/86   Pulse (!) 102   Temp 98.2 F (36.8 C) (Oral)   Resp 18   Ht 5\' 4"  (1.626 m)   Wt 190 lb (86.2 kg)   LMP 06/09/2020   SpO2 100%   BMI 32.61 kg/m   Imaging reviewed.   Labs:  COAGS: Recent Labs    06/12/20 0232  INR 1.3*  APTT 35    BMP: Recent Labs    06/11/20 1421 06/12/20 0803  NA 135 135  K 3.9 3.7  CL 105 104  CO2 21* 19*  GLUCOSE 115* 103*  BUN 20 20  CALCIUM 8.6* 8.7*  CREATININE 1.19* 1.13*  GFRNONAA 59* >60  GFRAA >60 >60    Electronically Signed: 06/14/20, NP 06/12/2020, 9:22 AM   I spent a total of 15 minutes in face to face in clinical consultation, greater than 50% of which was counseling/coordinating care for venous access.

## 2020-06-12 NOTE — H&P (Addendum)
History and Physical    Catherine Martinez NGE:[id number] DOB: [birth year] DOA: 06/11/2020  Referring MD/NP/PA: Harold Hedge, MD PCP: Patient, No Pcp Per  Patient coming from: Home via EMS  Chief Complaint:   Dizziness  I have personally briefly reviewed patient's old medical records in [facility]   HPI: Catherine Martinez is a 34 y.o. female with medical history significant of HIV, UTIs, and anemia presents with complaints of dizziness.  Last week she had noticed a strong odor to her urine and was having back pain.  She was evaluated 5 days ago and found to have signs of a urinary tract infection/pyelonephritis started on ciprofloxacin.  Last urinary tract infection was over a year ago.  She has been taking the medication as advised and only gets nauseous with it.  She reports that the back pain had been getting better.  However, after taking a dose of the medicine yesterday morning felt as though she may pass out.  Noted associated symptoms of a headache, but that has since resolved.  Denies having any loss of consciousness, vomiting, chest pain, shortness of breath, cough, diarrhea, easy bruising, or bleeding.  She came to the emergency department for further evaluation as symptoms persisted.  Patient admits that she has not been seen by infectious disease in over a year after losing her insurance.  However, she has been taking her antiretroviral medications as prescribed.  ED Course: Upon admission into the emergency department patient was initially seen to be afebrile, pulse 87-1 02, respirations 14-22, and all other vital signs maintained.  Labs significant for WBC 5.1, hemoglobin 8.2 with low MCV/MCH, platelets 6, total protein 8.7, total bilirubin 7.3, D-dimer 13.81, INR 1.3, PT 15.4, and LDH 1410.  Peripheral smear had showed signs of schistocytes.  Oncology have been consulted ordered further work-up for TTP.  IR has been consulted to place a temporary pheresis catheter to start  plasma exchange.   Case had been discussed with PCCM recommended general medicine admission.  TRH called to admit.  Review of Systems  Constitutional: Positive for malaise/fatigue. Negative for fever.  HENT: Negative for congestion and nosebleeds.   Eyes: Negative for photophobia and pain.  Respiratory: Negative for cough and shortness of breath.   Cardiovascular: Negative for chest pain and leg swelling.  Gastrointestinal: Positive for nausea. Negative for blood in stool, diarrhea and vomiting.  Genitourinary: Positive for flank pain. Negative for hematuria.  Musculoskeletal: Negative for falls.  Skin: Negative for rash.  Neurological: Positive for dizziness. Negative for loss of consciousness.  Endo/Heme/Allergies: Negative for polydipsia. Does not bruise/bleed easily.  Psychiatric/Behavioral: Negative for memory loss and substance abuse.    Past Medical History:  Diagnosis Date  . Anemia    pregnant  . History of stillbirth   . HIV (human immunodeficiency virus infection) (HCC)   . Kidney infection    Recurrent hx  . Kidney infection   . SVD (spontaneous vaginal delivery)    x 4    Past Surgical History:  Procedure Laterality Date  . CESAREAN SECTION WITH BILATERAL TUBAL LIGATION N/A 12/28/2012   Procedure: CESAREAN SECTION WITH BILATERAL TUBAL LIGATION;  Surgeon: [name], MD;  Location: [facility];  Service: Obstetrics;  Laterality: N/A;  AZT prior to section  . TOOTH EXTRACTION N/A 08/25/2015   Procedure: MULTIPLE EXTRACTIONS TEETH # 1, 2, 3, 4, 5, 6, 7, 8, 9, 10, 11, 12, 13, 14, 15, 16, 17, 18, 31, 32 AND ALVEOLOPLASTY;  Surgeon: [name], DDS;  Location: [facility] OR;  Service: Oral Surgery;  Laterality: N/A;     reports that she quit smoking about 10 years ago. Her smoking use included cigarettes. She has a 1.00 pack-year smoking history. She has never used smokeless tobacco. She reports that she does not drink alcohol and does not use drugs.  Allergies  Allergen Reactions  .  Sulfamethoxazole-Trimethoprim Hives    Family History  Problem Relation Age of Onset  . Diabetes Mother   . Diabetes Maternal Aunt   . Diabetes Maternal Grandfather   . Hyperlipidemia Maternal Grandfather   . Other Neg Hx     Prior to Admission medications   Medication Sig Start Date End Date Taking? Authorizing Provider  ciprofloxacin (CIPRO) 500 MG tablet Take 500 mg by mouth 2 (two) times daily. 06/06/20  Yes [provider]  Dolutegravir Sodium (TIVICAY) 50 MG TABS Take 1 tablet (50 mg total) by mouth daily. 02/03/13  Yes Daiva EvesVan Dam, Lisette Grinderornelius N, MD  acyclovir (ZOVIRAX) 400 MG tablet Take 2 tablets (800 mg total) by mouth 5 (five) times daily. X 5 days Patient not taking: Reported on 06/12/2020 03/25/13   Trixie DredgeWest, Emily, PA-C  ibuprofen (ADVIL) 600 MG tablet Take 1 tablet (600 mg total) by mouth every 8 (eight) hours as needed. Patient not taking: Reported on 06/12/2020 08/07/19   Linwood DibblesKnapp, Jon, MD  valACYclovir (VALTREX) 1000 MG tablet Take 1 tablet (1,000 mg total) by mouth 3 (three) times daily. Patient not taking: Reported on 06/12/2020 08/07/19   Linwood DibblesKnapp, Jon, MD    Physical Exam:  Constitutional: Young female NAD, calm, comfortable Vitals:   06/12/20 0500 06/12/20 0630 06/12/20 0645 06/12/20 0700  BP: 122/88 119/60 117/85 126/87  Pulse: 91 96 87 94  Resp:    18  Temp:      TempSrc:      SpO2: 100% 100% 100% 100%  Weight:       Eyes: PERRL, lids and conjunctivae normal ENMT: Mucous membranes are moist. Posterior pharynx clear of any exudate or lesions.  Neck: normal, supple, no masses, no thyromegaly Respiratory: clear to auscultation bilaterally, no wheezing, no crackles. Normal respiratory effort. No accessory muscle use.  Cardiovascular: Regular rate and rhythm, no murmurs / rubs / gallops. No extremity edema. 2+ pedal pulses. No carotid bruits.  Abdomen: no tenderness, no masses palpated. No hepatosplenomegaly. Bowel sounds positive.  Musculoskeletal: no clubbing /  cyanosis. No joint deformity upper and lower extremities. Good ROM, no contractures. Normal muscle tone.  Skin: no rashes, lesions, ulcers. No induration Neurologic: CN 2-12 grossly intact. Sensation intact, DTR normal. Strength 5/5 in all 4.  Psychiatric: Normal judgment and insight. Alert and oriented x 3. Normal mood.     Labs on Admission: I have personally reviewed following labs and imaging studies  CBC: Recent Labs  Lab 06/11/20 1421 06/11/20 1622 06/12/20 0232  WBC 5.1 5.4  --   NEUTROABS  --  2.9  --   HGB 8.2* 8.2*  --   HCT 25.7* 25.7*  --   MCV 74.1* 74.3*  --   PLT 6* 6* 6*   Basic Metabolic Panel: Recent Labs  Lab 06/11/20 1421  NA 135  K 3.9  CL 105  CO2 21*  GLUCOSE 115*  BUN 20  CREATININE 1.19*  CALCIUM 8.6*   GFR: Estimated Creatinine Clearance: 70.1 mL/min (A) (by C-G formula based on SCr of 1.19 mg/dL (H)). Liver Function Tests: Recent Labs  Lab 06/12/20 0232  AST 76*  ALT 44  ALKPHOS 54  BILITOT 7.3*  PROT 8.7*  ALBUMIN 3.4*   No results for input(s): LIPASE, AMYLASE in the last 168 hours. No results for input(s): AMMONIA in the last 168 hours. Coagulation Profile: Recent Labs  Lab 06/12/20 0232  INR 1.3*   Cardiac Enzymes: No results for input(s): CKTOTAL, CKMB, CKMBINDEX, TROPONINI in the last 168 hours. BNP (last 3 results) No results for input(s): PROBNP in the last 8760 hours. HbA1C: No results for input(s): HGBA1C in the last 72 hours. CBG: No results for input(s): GLUCAP in the last 168 hours. Lipid Profile: No results for input(s): CHOL, HDL, LDLCALC, TRIG, CHOLHDL, LDLDIRECT in the last 72 hours. Thyroid Function Tests: No results for input(s): TSH, T4TOTAL, FREET4, T3FREE, THYROIDAB in the last 72 hours. Anemia Panel: No results for input(s): VITAMINB12, FOLATE, FERRITIN, TIBC, IRON, RETICCTPCT in the last 72 hours. Urine analysis:    Component Value Date/Time   COLORURINE RED (A) 06/12/2020 0521   APPEARANCEUR  TURBID (A) 06/12/2020 0521   LABSPEC  06/12/2020 0521    TEST NOT REPORTED DUE TO COLOR INTERFERENCE OF URINE PIGMENT   PHURINE  06/12/2020 0521    TEST NOT REPORTED DUE TO COLOR INTERFERENCE OF URINE PIGMENT   GLUCOSEU (A) 06/12/2020 0521    TEST NOT REPORTED DUE TO COLOR INTERFERENCE OF URINE PIGMENT   GLUCOSEU NEG mg/dL 04/19/1600 0932   HGBUR (A) 06/12/2020 0521    TEST NOT REPORTED DUE TO COLOR INTERFERENCE OF URINE PIGMENT   BILIRUBINUR (A) 06/12/2020 0521    TEST NOT REPORTED DUE TO COLOR INTERFERENCE OF URINE PIGMENT   KETONESUR (A) 06/12/2020 0521    TEST NOT REPORTED DUE TO COLOR INTERFERENCE OF URINE PIGMENT   PROTEINUR (A) 06/12/2020 0521    TEST NOT REPORTED DUE TO COLOR INTERFERENCE OF URINE PIGMENT   UROBILINOGEN 2.0 (H) 04/01/2013 0402   NITRITE (A) 06/12/2020 0521    TEST NOT REPORTED DUE TO COLOR INTERFERENCE OF URINE PIGMENT   LEUKOCYTESUR (A) 06/12/2020 0521    TEST NOT REPORTED DUE TO COLOR INTERFERENCE OF URINE PIGMENT   Sepsis Labs: No results found for this or any previous visit (from the past 240 hour(s)).   Radiological Exams on Admission: CT Head Wo Contrast  Result Date: 06/12/2020 CLINICAL DATA:  Dizziness. Hypotension. Cerebral hemorrhage suspected. EXAM: CT HEAD WITHOUT CONTRAST TECHNIQUE: Contiguous axial images were obtained from the base of the skull through the vertex without intravenous contrast. COMPARISON:  None. FINDINGS: Brain: No acute infarct, hemorrhage, or mass lesion is present. No significant white matter lesions are present. The ventricles are of normal size. No significant extraaxial fluid collection is present. The brainstem and cerebellum are within normal limits. Vascular: No hyperdense vessel or unexpected calcification. Skull: Vertebral body heights and alignment are normal. Calvarium is intact. No focal lytic or blastic lesions are present. No significant extracranial soft tissue lesion is present. Sinuses/Orbits: The paranasal sinuses  and mastoid air cells are clear. The globes and orbits are within normal limits. IMPRESSION: Negative CT of the head. Electronically Signed   By: Marin Roberts M.D.   On: 06/12/2020 04:43    EKG: Independently reviewed.  Sinus tachycardia at 101 bpm with new T wave inversion  Assessment/Plan  Thrombotic thrombocytopenic purpura: Patient presented for complaints of lightheadedness and was found to have platelet count of 6.  Peripheral smear concerning for schistocytes and LDH elevated to 1410.  Differential included DIC, HUS, HIT. -Admit to a medical telemetry bed -Follow-up Coombs testing, haptoglobin,  and ADAMTS13 -N.p.o. for need of plasmapheresis catheter placement by IR -Prednisone 80 mg daily -Appreciate oncology and IR consultative services, will follow-up for further recommendations   Elevated troponin: Acute.  Initial troponin elevated 201 on admission with EKG showing appears to be new T wave inversions.  Patient denies any reports of chest pain at this time -Continue to monitor troponin  Urinary tract infection Trichomonas infection: Acute.  Patient developed strong urine odor and back pain last week.  Appears to have been diagnosed with urinary tract infection with with her ephritis for which she was started on ciprofloxacin on 8/17.  Urinalysis from today was significant for hematuria with trichomonas present and many bacteria. -Check urine culture -Discontinue ciprofloxacin as unclear if this provoked symptoms -Rocephin and metronidazole IV.  Transition to p.o. meds when medically appropriate  Acute kidney injury: Patient baseline kidney function previously noted to be around 0.8 back in 2016.  Patient presents with creatinine elevated up to 1.19 with BUN 20.  -Normal saline IV fluids at 75 mL/h x 1 L -Continue to monitor kidney function daily  Microcytic hypochromic anemia: Acute on chronic.  Hemoglobin 8.2 with low MCV and MCH.  Patient previously on iron  supplementation not reported taking any. -Follow-up anemia panel -Transfuse 1 unit of packed red blood cells per oncology recommendation  Hyperbilirubinemia Transaminitis: Acute.  Patient was noted to have total bilirubin elevated up to 7.3 with indirect 6.1 and AST 76. -Continue to monitor  HIV: Home medications include Tivicay 50 mg daily which patient states that she has been taking as advised.  However, patient has not followed up with infectious disease since she lost her insurance over a year ago.  Thinks she was seen by Dr. Ronnie Derby. -Continue current medication regimen -Dr. Ilsa Iha of ID consulted to help reestablish care   DVT prophylaxis: SCDs Code Status: Full Family Communication: Declined when asked if she would like me to update any family members of her admission into the hospital. Disposition Plan: Likely discharge home once platelet counts improved. Consults called: Oncology/IR Admission status: Inpatient  Clydie Braun MD [facility] Pager [phone]   If 7PM-7AM, please contact night-coverage [email or web address] Password [facility]  06/12/2020, 7:29 AM

## 2020-06-12 NOTE — Consult Note (Signed)
[facility]  Total days of antibiotics 1 Reason for Consult: hiv disease    Referring Physician: smith  Principal Problem:   TTP (thrombotic thrombocytopenic purpura) (HCC) Active Problems:   HIV INFECTION   Trichomonas infection   Transaminitis   Acute lower UTI   Acute kidney injury superimposed on CKD (HCC)   Microcytic hypochromic anemia   Hyperbilirubinemia    HPI: Catherine Martinez is a 34 y.o. female with HIV disease, not currently in care, but is taking anti-retroviral "the one that starts with T", but misses 3- 7 doses per 30 days she estimates. She has not been in care in our clinic 2014. She states that she has had roughly a few days of antibiotics to treat a UTI. She states she had burning urination and nausea, thus had poor intake, in addition to dizziness. She was admitted on 8/23 for thrombocytopenia, TBili of 7.3 with direct of 6, LDH elevated at 1410. She was also found to have elevated troponin. And cr at 1.19, her blood smear showed evidence of schistocytes concerning for TTP. She has not had a covid vaccine yet. She was evaluated by hematology to start steroids and plasma exchange. She denies history of easy bruising or epistaxis or menstrual spotting.  Past Medical History:  Diagnosis Date   Anemia    pregnant   History of stillbirth    HIV (human immunodeficiency virus infection) (HCC)    Kidney infection    Recurrent hx   Kidney infection    SVD (spontaneous vaginal delivery)    x 4    Allergies:  Allergies  Allergen Reactions   Sulfamethoxazole-Trimethoprim Hives     MEDICATIONS:  albuterol  2.5 mg Nebulization Q6H   calcium carbonate       dolutegravir  50 mg Oral Daily   emtricitabine-tenofovir AF  1 tablet Oral Daily   lidocaine       predniSONE  80 mg Oral Q breakfast   sodium chloride flush  3 mL Intravenous Once   sodium chloride flush  3 mL Intravenous Q12H    Social History   Tobacco Use    Smoking status: Former Smoker    Packs/day: 1.00    Years: 1.00    Pack years: 1.00    Types: Cigarettes    Quit date: 10/21/2009    Years since quitting: 10.6   Smokeless tobacco: Never Used  Substance Use Topics   Alcohol use: No   Drug use: No    Family History  Problem Relation Age of Onset   Diabetes Mother    Diabetes Maternal Aunt    Diabetes Maternal Grandfather    Hyperlipidemia Maternal Grandfather    Other Neg Hx      Review of Systems  Constitutional: Negative for fever, chills, diaphoresis, activity change, appetite change, fatigue and unexpected weight change.  HENT: Negative for congestion, sore throat, rhinorrhea, sneezing, trouble swallowing and sinus pressure.  Eyes: Negative for photophobia and visual disturbance.  Respiratory: Negative for cough, chest tightness, shortness of breath, wheezing and stridor.  Cardiovascular: Negative for chest pain, palpitations and leg swelling.  Gastrointestinal: Negative for nausea, vomiting, abdominal pain, diarrhea, constipation, blood in stool, abdominal distention and anal bleeding.  Genitourinary: Negative for dysuria, hematuria, flank pain and difficulty urinating.  Musculoskeletal: Negative for myalgias, back pain, joint swelling, arthralgias and gait problem.  Skin: Negative for color change, pallor, rash and wound.  Neurological: positive for dizziness, but negative for tremors, weakness and  light-headedness.  Hematological: Negative for adenopathy. Does not bruise/bleed easily.  Psychiatric/Behavioral: Negative for behavioral problems, confusion, sleep disturbance, dysphoric mood, decreased concentration and agitation.     OBJECTIVE: Temp:  [96.9 F (36.1 C)-99.1 F (37.3 C)] 98.9 F (37.2 C) (08/23 1640) Pulse Rate:  [86-115] 97 (08/23 1640) Resp:  [14-22] 20 (08/23 1640) BP: (110-138)/(60-100) 118/82 (08/23 1640) SpO2:  [100 %] 100 % (08/23 1616) Weight:  [86.2 kg] 86.2 kg (08/23 0754) Physical  Exam  Constitutional:  oriented to person, place, and time. appears well-developed and well-nourished. No distress.  HENT: [place]/AT, PERRLA, + scleral icterus Mouth/Throat: Oropharynx is clear and moist. No oropharyngeal exudate.  Neck: right HD catheter Cardiovascular: Normal rate, regular rhythm and normal heart sounds. Exam reveals no gallop and no friction rub.  No murmur heard.  Pulmonary/Chest: Effort normal and breath sounds normal. No respiratory distress.  has no wheezes.  Neck = supple, no nuchal rigidity Abdominal: Soft. Bowel sounds are normal.  exhibits no distension. There is no tenderness.  Lymphadenopathy: no cervical adenopathy. No axillary adenopathy Neurological: alert and oriented to person, place, and time.  Skin: Skin is warm and dry. No rash noted. No erythema.  Psychiatric: a normal mood and affect.  behavior is normal.   LABS: Results for orders placed or performed during the hospital encounter of 06/11/20 (from the past 48 hour(s))  Basic metabolic panel     Status: Abnormal   Collection Time: 06/11/20  2:21 PM  Result Value Ref Range   Sodium 135 135 - 145 mmol/L   Potassium 3.9 3.5 - 5.1 mmol/L   Chloride 105 98 - 111 mmol/L   CO2 21 (L) 22 - 32 mmol/L   Glucose, Bld 115 (H) 70 - 99 mg/dL    Comment: Glucose reference range applies only to samples taken after fasting for at least 8 hours.   BUN 20 6 - 20 mg/dL   Creatinine, Ser 6.041.19 (H) 0.44 - 1.00 mg/dL    Comment: ICTERUS AT THIS LEVEL MAY AFFECT RESULT   Calcium 8.6 (L) 8.9 - 10.3 mg/dL   GFR calc non Af Amer 59 (L) >60 mL/min   GFR calc Af Amer >60 >60 mL/min   Anion gap 9 5 - 15    Comment: Performed at [facility], [address] [place], [place] [address]  CBC     Status: Abnormal   Collection Time: 06/11/20  2:21 PM  Result Value Ref Range   WBC 5.1 4.0 - 10.5 K/uL   RBC 3.47 (L) 3.87 - 5.11 MIL/uL   Hemoglobin 8.2 (L) 12.0 - 15.0 g/dL    Comment: Reticulocyte Hemoglobin testing may be  clinically indicated, consider ordering this additional test [id number]    HCT 25.7 (L) 36 - 46 %   MCV 74.1 (L) 80.0 - 100.0 fL   MCH 23.6 (L) 26.0 - 34.0 pg   MCHC 31.9 30.0 - 36.0 g/dL   RDW 95.619.1 (H) 21.311.5 - 08.615.5 %   Platelets 6 (LL) 150 - 400 K/uL    Comment: REPEATED TO VERIFY PLATELET COUNT CONFIRMED BY SMEAR Immature Platelet Fraction may be clinically indicated, consider ordering this additional test [id number] THIS CRITICAL RESULT HAS VERIFIED AND BEEN CALLED TO KARIE NEWMAN RN BY SHANNON FLEMING ON 08 22 2021 AT 1525, AND HAS BEEN READ BACK.     nRBC 0.0 0.0 - 0.2 %    Comment: Performed at [facility], [address] [place], [place] [address]  I-Stat beta hCG blood, ED     Status: None   Collection Time: 06/11/20  2:42 PM  Result Value Ref Range   I-stat hCG, quantitative <5.0 <5 mIU/mL   Comment 3            Comment:   GEST. AGE      CONC.  (mIU/mL)   <=1 WEEK        5 - 50     2 WEEKS       50 - 500     3 WEEKS       100 - 10,000     4 WEEKS     1,000 - 30,000        FEMALE AND NON-PREGNANT FEMALE:     LESS THAN 5 mIU/mL   CBC with Differential     Status: Abnormal   Collection Time: 06/11/20  4:22 PM  Result Value Ref Range   WBC 5.4 4.0 - 10.5 K/uL   RBC 3.46 (L) 3.87 - 5.11 MIL/uL   Hemoglobin 8.2 (L) 12.0 - 15.0 g/dL    Comment: Reticulocyte Hemoglobin testing may be clinically indicated, consider ordering this additional test [id number]    HCT 25.7 (L) 36 - 46 %   MCV 74.3 (L) 80.0 - 100.0 fL   MCH 23.7 (L) 26.0 - 34.0 pg   MCHC 31.9 30.0 - 36.0 g/dL   RDW 65.0 (H) 35.4 - 65.6 %   Platelets 6 (LL) 150 - 400 K/uL    Comment: REPEATED TO VERIFY SPECIMEN CHECKED FOR CLOTS Immature Platelet Fraction may be clinically indicated, consider ordering this additional test [id number] CRITICAL VALUE NOTED.  VALUE IS CONSISTENT WITH PREVIOUSLY REPORTED AND CALLED VALUE.    nRBC 0.0 0.0 - 0.2 %   Neutrophils Relative % 53 %   Neutro Abs 2.9 1.7 - 7.7  K/uL   Lymphocytes Relative 33 %   Lymphs Abs 1.8 0.7 - 4.0 K/uL   Monocytes Relative 9 %   Monocytes Absolute 0.5 0 - 1 K/uL   Eosinophils Relative 1 %   Eosinophils Absolute 0.0 0 - 0 K/uL   Basophils Relative 0 %   Basophils Absolute 0.0 0 - 0 K/uL   Immature Granulocytes 4 %   Abs Immature Granulocytes 0.22 (H) 0.00 - 0.07 K/uL    Comment: Performed at [facility], [address], [place], [place] [address]  Hepatic function panel     Status: Abnormal   Collection Time: 06/12/20  2:32 AM  Result Value Ref Range   Total Protein 8.7 (H) 6.5 - 8.1 g/dL   Albumin 3.4 (L) 3.5 - 5.0 g/dL   AST 76 (H) 15 - 41 U/L   ALT 44 0 - 44 U/L   Alkaline Phosphatase 54 38 - 126 U/L   Total Bilirubin 7.3 (H) 0.3 - 1.2 mg/dL   Bilirubin, Direct 1.2 (H) 0.0 - 0.2 mg/dL   Indirect Bilirubin 6.1 (H) 0.3 - 0.9 mg/dL    Comment: Performed at [facility], [address], [place], [place] [address]  Troponin I (High Sensitivity)     Status: Abnormal   Collection Time: 06/12/20  2:32 AM  Result Value Ref Range   Troponin I (High Sensitivity) 201 (HH) <18 ng/L    Comment: CRITICAL RESULT CALLED TO, READ BACK BY AND VERIFIED WITH: RN A DENNES @ [phone] 06/12/20 BY S GEZAHEGN (NOTE) Elevated high sensitivity troponin I (hsTnI) values and significant  changes across serial  measurements may suggest ACS but many other  chronic and acute conditions are known to elevate hsTnI results.  Refer to the Links section for chest pain algorithms and additional  guidance. Performed at [facility], [address], [place], [place] [address]   DIC Panel (Not at [facility]) ONCE - STAT     Status: Abnormal   Collection Time: 06/12/20  2:32 AM  Result Value Ref Range   Prothrombin Time 15.4 (H) 11.4 - 15.2 seconds   INR 1.3 (H) 0.8 - 1.2    Comment: (NOTE) INR goal varies based on device and disease states.    aPTT 35 24 - 36 seconds   Fibrinogen 389 210 - 475 mg/dL   D-Dimer, Quant 60.63 (H) 0.00 -  0.50 ug/mL-FEU    Comment: (NOTE) At the manufacturer cut-off of 0.50 ug/mL FEU, this assay has been documented to exclude PE with a sensitivity and negative predictive value of 97 to 99%.  At this time, this assay has not been approved by the FDA to exclude DVT/VTE. Results should be correlated with clinical presentation.    Platelets 6 (LL) 150 - 400 K/uL    Comment: REPEATED TO VERIFY Immature Platelet Fraction may be clinically indicated, consider ordering this additional test [id number] CRITICAL VALUE NOTED.  VALUE IS CONSISTENT WITH PREVIOUSLY REPORTED AND CALLED VALUE.    Smear Review NO SCHISTOCYTES SEEN     Comment: Performed at [facility], [address], [place], [place] [address]  Type and screen     Status: None (Preliminary result)   Collection Time: 06/12/20  2:37 AM  Result Value Ref Range   ABO/RH(D) O POS    Antibody Screen POS    Sample Expiration 06/15/2020,2359    Antibody Identification ANTI LEA Melvyn Neth a)    Unit Number [id number]    Blood Component Type RED CELLS,LR    Unit division 00    Status of Unit ISSUED    Transfusion Status OK TO TRANSFUSE    Crossmatch Result COMPATIBLE    Unit Number [id number]    Blood Component Type RED CELLS,LR    Unit division 00    Status of Unit ALLOCATED    Transfusion Status OK TO TRANSFUSE    Crossmatch Result COMPATIBLE   Lactate dehydrogenase     Status: Abnormal   Collection Time: 06/12/20  4:13 AM  Result Value Ref Range   LDH 1,410 (H) 98 - 192 U/L    Comment: Performed at [facility], [address], [place], [place] [address]  Save Smear     Status: None   Collection Time: 06/12/20  4:13 AM  Result Value Ref Range   Smear Review SMEAR STAINED AND AVAILABLE FOR REVIEW     Comment: Performed at [facility], [address], [place], [place] [address]  Urinalysis, Routine w reflex microscopic Urine, Random     Status: Abnormal   Collection Time: 06/12/20  5:21 AM  Result Value Ref  Range   Color, Urine RED (A) YELLOW   APPearance TURBID (A) CLEAR   Specific Gravity, Urine  1.005 - 1.030    TEST NOT REPORTED DUE TO COLOR INTERFERENCE OF URINE PIGMENT   pH  5.0 - 8.0    TEST NOT REPORTED DUE TO COLOR INTERFERENCE OF URINE PIGMENT   Glucose, UA (A) NEGATIVE mg/dL    TEST NOT REPORTED DUE TO COLOR INTERFERENCE OF URINE PIGMENT   Hgb urine dipstick (A) NEGATIVE  TEST NOT REPORTED DUE TO COLOR INTERFERENCE OF URINE PIGMENT   Bilirubin Urine (A) NEGATIVE    TEST NOT REPORTED DUE TO COLOR INTERFERENCE OF URINE PIGMENT   Ketones, ur (A) NEGATIVE mg/dL    TEST NOT REPORTED DUE TO COLOR INTERFERENCE OF URINE PIGMENT   Protein, ur (A) NEGATIVE mg/dL    TEST NOT REPORTED DUE TO COLOR INTERFERENCE OF URINE PIGMENT   Nitrite (A) NEGATIVE    TEST NOT REPORTED DUE TO COLOR INTERFERENCE OF URINE PIGMENT   Leukocytes,Ua (A) NEGATIVE    TEST NOT REPORTED DUE TO COLOR INTERFERENCE OF URINE PIGMENT   RBC / HPF >50 (H) 0 - 5 RBC/hpf   WBC, UA >50 (H) 0 - 5 WBC/hpf   Bacteria, UA MANY (A) NONE SEEN   Squamous Epithelial / LPF >50 (H) 0 - 5   WBC Clumps PRESENT    Trichomonas, UA PRESENT (A) NONE SEEN    Comment: Performed at [facility], [address] [place], [place] [address]  Therapeutic plasma exchange (blood bank)     Status: None (Preliminary result)   Collection Time: 06/12/20  7:23 AM  Result Value Ref Range   Plasma Exchange 3100 mL REQUESTED, 3117 mL THAWED    Plasma volume needed 3,100    Unit Number [id number]    Blood Component Type THAWED PLASMA    Unit division 00    Status of Unit ISSUED    Transfusion Status OK TO TRANSFUSE    Unit Number [id number]    Blood Component Type THW PLS APHR    Unit division A0    Status of Unit ISSUED    Transfusion Status OK TO TRANSFUSE    Unit Number [id number]    Blood Component Type THAWED PLASMA    Unit division 00    Status of Unit ISSUED    Transfusion Status OK TO TRANSFUSE    Unit Number  [id number]    Blood Component Type THAWED PLASMA    Unit division 00    Status of Unit ISSUED    Transfusion Status OK TO TRANSFUSE    Unit Number [id number]    Blood Component Type THAWED PLASMA    Unit division 00    Status of Unit ISSUED    Transfusion Status OK TO TRANSFUSE    Unit Number [id number]    Blood Component Type THW PLS APHR    Unit division B0    Status of Unit ISSUED    Transfusion Status OK TO TRANSFUSE    Unit Number [id number]    Blood Component Type THAWED PLASMA    Unit division 00    Status of Unit ISSUED    Transfusion Status OK TO TRANSFUSE    Unit Number [id number]    Blood Component Type THAWED PLASMA    Unit division 00    Status of Unit ISSUED    Transfusion Status OK TO TRANSFUSE    Unit Number [id number]    Blood Component Type THW PLS APHR    Unit division B0    Status of Unit ISSUED    Transfusion Status OK TO TRANSFUSE    Unit Number [id number]    Blood Component Type THW PLS APHR    Unit division 00    Status of Unit ISSUED    Transfusion Status OK TO TRANSFUSE    Unit Number [id number]    Blood Component Type THW PLS APHR    Unit division B0    Status  of Unit ISSUED    Transfusion Status      OK TO TRANSFUSE Performed at [facility], [address] [place], [place] [address]    Unit Number [id number]    Blood Component Type THW PLS APHR    Unit division B0    Status of Unit ISSUED    Transfusion Status OK TO TRANSFUSE   Troponin I (High Sensitivity)     Status: Abnormal   Collection Time: 06/12/20  8:02 AM  Result Value Ref Range   Troponin I (High Sensitivity) 192 (HH) <18 ng/L    Comment: CRITICAL VALUE NOTED.  VALUE IS CONSISTENT WITH PREVIOUSLY REPORTED AND CALLED VALUE. (NOTE) Elevated high sensitivity troponin I (hsTnI) values and significant  changes across serial measurements may suggest ACS but many other  chronic and acute conditions are known to elevate hsTnI results.  Refer to  the Links section for chest pain algorithms and additional  guidance. Performed at [facility], [address] [place], [place] [address]   Basic metabolic panel     Status: Abnormal   Collection Time: 06/12/20  8:03 AM  Result Value Ref Range   Sodium 135 135 - 145 mmol/L   Potassium 3.7 3.5 - 5.1 mmol/L   Chloride 104 98 - 111 mmol/L   CO2 19 (L) 22 - 32 mmol/L   Glucose, Bld 103 (H) 70 - 99 mg/dL    Comment: Glucose reference range applies only to samples taken after fasting for at least 8 hours.   BUN 20 6 - 20 mg/dL   Creatinine, Ser 6.21 (H) 0.44 - 1.00 mg/dL   Calcium 8.7 (L) 8.9 - 10.3 mg/dL   GFR calc non Af Amer >60 >60 mL/min   GFR calc Af Amer >60 >60 mL/min   Anion gap 12 5 - 15    Comment: Performed at [facility], [address], [place], [place] [address]  Vitamin B12     Status: None   Collection Time: 06/12/20  8:03 AM  Result Value Ref Range   Vitamin B-12 479 180 - 914 pg/mL    Comment: (NOTE) This assay is not validated for testing neonatal or myeloproliferative syndrome specimens for Vitamin B12 levels. Performed at [facility], [address], [place], [place] [address]   Folate     Status: None   Collection Time: 06/12/20  8:03 AM  Result Value Ref Range   Folate 13.8 >5.9 ng/mL    Comment: Performed at [facility], [address], [place], [place] [address]  Iron and TIBC     Status: Abnormal   Collection Time: 06/12/20  8:03 AM  Result Value Ref Range   Iron 154 28 - 170 ug/dL   TIBC 841 324 - 401 ug/dL   Saturation Ratios 51 (H) 10.4 - 31.8 %   UIBC 150 ug/dL    Comment: Performed at [facility], [address], [place], [place] [address]  Ferritin     Status: Abnormal   Collection Time: 06/12/20  8:03 AM  Result Value Ref Range   Ferritin 910 (H) 11 - 307 ng/mL    Comment: Performed at [facility], [address], [place], [place] [address]  Reticulocytes     Status: Abnormal   Collection Time:  06/12/20  8:03 AM  Result Value Ref Range   Retic Ct Pct 2.8 0.4 - 3.1 %   RBC. 3.10 (L) 3.87 -  5.11 MIL/uL   Retic Count, Absolute 88.0 19.0 - 186.0 K/uL   Immature Retic Fract 24.7 (H) 2.3 - 15.9 %    Comment: Performed at [facility], [address] [place], [place] [address]  SARS Coronavirus 2 by RT PCR (hospital order, performed in [facility] hospital lab) Nasopharyngeal Nasopharyngeal Swab     Status: None   Collection Time: 06/12/20  8:03 AM   Specimen: Nasopharyngeal Swab  Result Value Ref Range   SARS Coronavirus 2 NEGATIVE NEGATIVE    Comment: (NOTE) SARS-CoV-2 target nucleic acids are NOT DETECTED.  The SARS-CoV-2 RNA is generally detectable in upper and lower respiratory specimens during the acute phase of infection. The lowest concentration of SARS-CoV-2 viral copies this assay can detect is 250 copies / mL. A negative result does not preclude SARS-CoV-2 infection and should not be used as the sole basis for treatment or other patient management decisions.  A negative result may occur with improper specimen collection / handling, submission of specimen other than nasopharyngeal swab, presence of viral mutation(s) within the areas targeted by this assay, and inadequate number of viral copies (<250 copies / mL). A negative result must be combined with clinical observations, patient history, and epidemiological information.  Fact Sheet for Patients:   BoilerBrush.com.cy  Fact Sheet for Healthcare Providers: https://pope.com/  This test is not yet approved or  cleared by the [place] FDA and has been authorized for detection and/or diagnosis of SARS-CoV-2 by FDA under an Emergency Use Authorization (EUA).  This EUA will remain in effect (meaning this test can be used) for the duration of the COVID-19 declaration under Section 564(b)(1) of the Act, 21 U.S.C. section 360bbb-3(b)(1), unless the authorization is  terminated or revoked sooner.  Performed at [facility], [address], [place], [place] [address]   Prepare RBC (crossmatch)     Status: None   Collection Time: 06/12/20  8:39 AM  Result Value Ref Range   Order Confirmation      ORDER PROCESSED BY BLOOD BANK Performed at [facility], [address], [place], [place] [address]     MICRO:  IMAGING: CT Head Wo Contrast  Result Date: 06/12/2020 CLINICAL DATA:  Dizziness. Hypotension. Cerebral hemorrhage suspected. EXAM: CT HEAD WITHOUT CONTRAST TECHNIQUE: Contiguous axial images were obtained from the base of the skull through the vertex without intravenous contrast. COMPARISON:  None. FINDINGS: Brain: No acute infarct, hemorrhage, or mass lesion is present. No significant white matter lesions are present. The ventricles are of normal size. No significant extraaxial fluid collection is present. The brainstem and cerebellum are within normal limits. Vascular: No hyperdense vessel or unexpected calcification. Skull: Vertebral body heights and alignment are normal. Calvarium is intact. No focal lytic or blastic lesions are present. No significant extracranial soft tissue lesion is present. Sinuses/Orbits: The paranasal sinuses and mastoid air cells are clear. The globes and orbits are within normal limits. IMPRESSION: Negative CT of the head. Electronically Signed   By: Marin Roberts M.D.   On: 06/12/2020 04:43   IR Fluoro Guide CV Line Right  Result Date: 06/12/2020 CLINICAL DATA:  TTP, needs venous access for pheresis EXAM: EXAM RIGHT IJ CATHETER PLACEMENT UNDER ULTRASOUND AND FLUOROSCOPIC GUIDANCE TECHNIQUE: The procedure, risks (including but not limited to bleeding, infection, organ damage, pneumothorax), benefits, and alternatives were explained to the patient. Questions regarding the procedure were encouraged and answered. The patient understands and consents to the procedure. Patency of the right  IJ vein was confirmed  with ultrasound with image documentation. An appropriate skin site was determined. Skin site was marked. Region was prepped using maximum barrier technique including cap and mask, sterile gown, sterile gloves, large sterile sheet, and Chlorhexidine as cutaneous antisepsis. The region was infiltrated locally with 1% lidocaine. Under real-time ultrasound guidance, the right IJ vein was accessed with a 19 gauge needle; the needle tip within the vein was confirmed with ultrasound image documentation. The needle exchanged over a guidewire for vascular dilator which allowed advancement of a 16 cm Trialysis catheter. This was positioned with the tip at the cavoatrial junction. Spot chest radiograph shows good positioning and no pneumothorax. Catheter was flushed and sutured externally with 0-Prolene sutures. Patient tolerated the procedure well. FLUOROSCOPY TIME:  6 seconds, less than 0.1 mGy COMPLICATIONS: COMPLICATIONS none IMPRESSION: 1. Technically successful right IJ Trialysis catheter placement. Electronically Signed   By: Corlis Leak M.D.   On: 06/12/2020 14:12   IR US Guide Vasc Access Right  Result Date: 06/12/2020 CLINICAL DATA:  TTP, needs venous access for pheresis EXAM: EXAM RIGHT IJ CATHETER PLACEMENT UNDER ULTRASOUND AND FLUOROSCOPIC GUIDANCE TECHNIQUE: The procedure, risks (including but not limited to bleeding, infection, organ damage, pneumothorax), benefits, and alternatives were explained to the patient. Questions regarding the procedure were encouraged and answered. The patient understands and consents to the procedure. Patency of the right IJ vein was confirmed with ultrasound with image documentation. An appropriate skin site was determined. Skin site was marked. Region was prepped using maximum barrier technique including cap and mask, sterile gown, sterile gloves, large sterile sheet, and Chlorhexidine as cutaneous antisepsis. The region was infiltrated locally with 1% lidocaine. Under real-time  ultrasound guidance, the right IJ vein was accessed with a 19 gauge needle; the needle tip within the vein was confirmed with ultrasound image documentation. The needle exchanged over a guidewire for vascular dilator which allowed advancement of a 16 cm Trialysis catheter. This was positioned with the tip at the cavoatrial junction. Spot chest radiograph shows good positioning and no pneumothorax. Catheter was flushed and sutured externally with 0-Prolene sutures. Patient tolerated the procedure well. FLUOROSCOPY TIME:  6 seconds, less than 0.1 mGy COMPLICATIONS: COMPLICATIONS none IMPRESSION: 1. Technically successful right IJ Trialysis catheter placement. Electronically Signed   By: Corlis Leak M.D.   On: 06/12/2020 14:12    HISTORICAL MICRO/IMAGING  Assessment/Plan:  34yo F with poorly controlled HIV disease admitted for dizziness but found to have thrombocytopenia and signs for TTP  HIV disease=  Will get cd 4 count ,HIV viral load ,and genotype. Will change her hiv regimen to tivicay plus descovy. We will try to track down what she is taking by showing pictures of medications to see if need to change it further  Occasionally see ITP with HIV disease that is poorly controlled, however would not expect schistocytes  TTP = continue on steroids and plasmapheresis per hematology  Health mainteance = will reassess candidacy for covid vaccine towards end of hospitalization

## 2020-06-12 NOTE — Procedures (Signed)
  Procedure: R IJ 15cm Trialysis HD catheter placement   EBL:   minimal Complications:  none immediate  See full dictation in [employer].  Thora Lance MD Main # [phone] Pager  [phone]

## 2020-06-12 NOTE — Consult Note (Signed)
Reason for the request:    Thrombocytopenia  HPI: I was asked by Dr. Clayborne Dana to evaluate Catherine Martinez for thrombocytopenia.  She is a pleasant 34 year old woman with history of HIV and iron deficiency anemia presented with symptoms of dizziness and light headedness.  She was diagnosed with urinary tract infection and was started on Cipro last week.  Upon evaluation she had a CBC done showed a hemoglobin of 8.2, platelet count of 6 and normal white cell count.  Repeat CBC confirmed these findings.  Her chemistries showed creatinine is slightly elevated at 1.19 and bilirubin total at 7.3 with 6.1 direct subtype.  LDH is elevated at 1410 and her troponin is elevated at 201.  Previous CBC in 2016 showed mild microcytosis but normal platelets of 254.  Coagulation parameters showed a normal fibrinogen and elevated D-dimer.  Her INR is 1.3 and normal PTT.   Clinically, she feels reasonably fair without any complaints.  She denies any headaches, blurry vision or fevers.  She denies any chest pain or shortness of breath.  No bleeding or bruising complications.  She  does not report any headaches, blurry vision, syncope or seizures. Does not report any fevers, chills or sweats.  Does not report any cough, wheezing or hemoptysis.  Does not report any chest pain, palpitation, orthopnea or leg edema.  Does not report any nausea, vomiting or abdominal pain.  Does not report any constipation or diarrhea.  Does not report any skeletal complaints.    Does not report frequency, urgency or hematuria.  Does not report any skin rashes or lesions. Does not report any heat or cold intolerance.  Does not report any lymphadenopathy or petechiae.  Does not report any anxiety or depression.  Remaining review of systems is negative.    Past Medical History:  Diagnosis Date  . Anemia    pregnant  . History of stillbirth   . HIV (human immunodeficiency virus infection) (HCC)   . Kidney infection    Recurrent hx  . Kidney  infection   . SVD (spontaneous vaginal delivery)    x 4  :  Past Surgical History:  Procedure Laterality Date  . CESAREAN SECTION WITH BILATERAL TUBAL LIGATION N/A 12/28/2012   Procedure: CESAREAN SECTION WITH BILATERAL TUBAL LIGATION;  Surgeon: [name], MD;  Location: [facility];  Service: Obstetrics;  Laterality: N/A;  AZT prior to section  . TOOTH EXTRACTION N/A 08/25/2015   Procedure: MULTIPLE EXTRACTIONS TEETH # 1, 2, 3, 4, 5, 6, 7, 8, 9, 10, 11, 12, 13, 14, 15, 16, 17, 18, 31, 32 AND ALVEOLOPLASTY;  Surgeon: [name], DDS;  Location: [facility];  Service: Oral Surgery;  Laterality: N/A;  :   Current Facility-Administered Medications:  .  sodium chloride flush (NS) 0.9 % injection 3 mL, 3 mL, Intravenous, Once, Mesner, Barbara Cower, MD  Current Outpatient Medications:  .  ciprofloxacin (CIPRO) 500 MG tablet, Take 500 mg by mouth 2 (two) times daily., Disp: , Rfl:  .  Dolutegravir Sodium (TIVICAY) 50 MG TABS, Take 1 tablet (50 mg total) by mouth daily., Disp: 30 tablet, Rfl: 11 .  acyclovir (ZOVIRAX) 400 MG tablet, Take 2 tablets (800 mg total) by mouth 5 (five) times daily. X 5 days (Patient not taking: Reported on 06/12/2020), Disp: 50 tablet, Rfl: 0 .  ibuprofen (ADVIL) 600 MG tablet, Take 1 tablet (600 mg total) by mouth every 8 (eight) hours as needed. (Patient not taking: Reported on 06/12/2020), Disp: 21 tablet, Rfl: 0 .  valACYclovir (VALTREX) 1000 MG tablet, Take 1 tablet (1,000 mg total) by mouth 3 (three) times daily. (Patient not taking: Reported on 06/12/2020), Disp: 21 tablet, Rfl: 0:  Allergies  Allergen Reactions  . Sulfamethoxazole-Trimethoprim Hives  :  Family History  Problem Relation Age of Onset  . Diabetes Mother   . Diabetes Maternal Aunt   . Diabetes Maternal Grandfather   . Hyperlipidemia Maternal Grandfather   . Other Neg Hx   :  Social History   Socioeconomic History  . Marital status: Widowed    Spouse name: Not on file  . Number of children: Not on file   . Years of education: Not on file  . Highest education level: Not on file  Occupational History  . Not on file  Tobacco Use  . Smoking status: Former Smoker    Packs/day: 1.00    Years: 1.00    Pack years: 1.00    Types: Cigarettes    Quit date: 10/21/2009    Years since quitting: 10.6  . Smokeless tobacco: Never Used  Substance and Sexual Activity  . Alcohol use: No  . Drug use: No  . Sexual activity: Never    Birth control/protection: Surgical    Comment: pregnant  Other Topics Concern  . Not on file  Social History Narrative  . Not on file   Social Determinants of Health   Financial Resource Strain:   . Difficulty of Paying Living Expenses: Not on file  Food Insecurity:   . Worried About Programme researcher, broadcasting/film/video in the Last Year: Not on file  . Ran Out of Food in the Last Year: Not on file  Transportation Needs:   . Lack of Transportation (Medical): Not on file  . Lack of Transportation (Non-Medical): Not on file  Physical Activity:   . Days of Exercise per Week: Not on file  . Minutes of Exercise per Session: Not on file  Stress:   . Feeling of Stress : Not on file  Social Connections:   . Frequency of Communication with Friends and Family: Not on file  . Frequency of Social Gatherings with Friends and Family: Not on file  . Attends Religious Services: Not on file  . Active Member of Clubs or Organizations: Not on file  . Attends Banker Meetings: Not on file  . Marital Status: Not on file  Intimate Partner Violence:   . Fear of Current or Ex-Partner: Not on file  . Emotionally Abused: Not on file  . Physically Abused: Not on file  . Sexually Abused: Not on file  :  Pertinent items are noted in HPI.  Exam: Blood pressure 122/88, pulse 91, temperature 98.2 F (36.8 C), temperature source Oral, resp. rate (!) 22, weight 190 lb (86.2 kg), last menstrual period 06/09/2020, SpO2 100 %.  ECOG 0 General appearance: alert and cooperative appeared without  distress. Head: atraumatic without any abnormalities. Eyes: conjunctivae/corneas clear. PERRL.  Sclera anicteric. Throat: lips, mucosa, and tongue normal; without oral thrush or ulcers. Resp: clear to auscultation bilaterally without rhonchi, wheezes or dullness to percussion. Cardio: regular rate and rhythm, S1, S2 normal, no murmur, click, rub or gallop GI: soft, non-tender; bowel sounds normal; no masses,  no organomegaly Skin: Skin color, texture, turgor normal. No rashes or lesions Lymph nodes: Cervical, supraclavicular, and axillary nodes normal. Neurologic: Grossly normal without any motor, sensory or deep tendon reflexes. Musculoskeletal: No joint deformity or effusion.  Recent Labs    06/11/20 1421 06/11/20 1421  06/11/20 1622 06/12/20 0232  WBC 5.1  --  5.4  --   HGB 8.2*  --  8.2*  --   HCT 25.7*  --  25.7*  --   PLT 6*   < > 6* 6*   < > = values in this interval not displayed.   Recent Labs    06/11/20 1421  NA 135  K 3.9  CL 105  CO2 21*  GLUCOSE 115*  BUN 20  CREATININE 1.19*  CALCIUM 8.6*     Blood smear review: Personally reviewed and showed schistocytes at least 5-6 per high-power field.  Polychromasia and spherocytes are also noted.  CT Head Wo Contrast  Result Date: 06/12/2020 CLINICAL DATA:  Dizziness. Hypotension. Cerebral hemorrhage suspected. EXAM: CT HEAD WITHOUT CONTRAST TECHNIQUE: Contiguous axial images were obtained from the base of the skull through the vertex without intravenous contrast. COMPARISON:  None. FINDINGS: Brain: No acute infarct, hemorrhage, or mass lesion is present. No significant white matter lesions are present. The ventricles are of normal size. No significant extraaxial fluid collection is present. The brainstem and cerebellum are within normal limits. Vascular: No hyperdense vessel or unexpected calcification. Skull: Vertebral body heights and alignment are normal. Calvarium is intact. No focal lytic or blastic lesions are  present. No significant extracranial soft tissue lesion is present. Sinuses/Orbits: The paranasal sinuses and mastoid air cells are clear. The globes and orbits are within normal limits. IMPRESSION: Negative CT of the head. Electronically Signed   By: Marin Robertshristopher  Mattern M.D.   On: 06/12/2020 04:43    Assessment and Plan:   34 year old woman:  1.  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    Other considerations would be autoimmune thrombocytopenia with autoimmune hemolysis.  This would be less likely consideration at this time.  Other considerations such as DIC, HUS, HIT and vaccine induced thrombocytopenia are considered less likely.  To complete the work-up will obtain a Coombs testing, haptoglobin and ADAMTS13 activity at baseline.  Given the high index of suspicion for TTP, I would recommend proceeding with plasma exchange urgently while completing her work-up.  I will also start her on prednisone 60 mg daily during plasma exchange.  She will receive daily 1 plasma volume exchange with plasma until she has platelet recovery.  No platelet transfusion will be given unless she has active bleeding.  We will monitor daily LDH and CBC follow her progress.    I have discussed her case with charge nurse at the dialysis unit and she will commence plasma exchange as soon as her catheter is placed.   2.  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  I recommend obtaining iron panel and transfusing her with 1 unit of packed red cells in anticipation of further drop in her hemoglobin after plasma exchange.  3.  IV access: Her case was discussed with Dr. Archer AsaMcCullough from interventional radiology.  His team will kindly place a temporary pheresis catheter before transitioning her into a tunneled catheter in the near  future.  4.  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  Management per the primary team.  5.  HIV: She is currently on Tivicay.  I doubt it had any effect on her presentation.    100  minutes were dedicated to this visit.  50% of the time was face-to-face and spent  on reviewing laboratory data, imaging studies, discussing treatment options, discussing differential diagnosis and coordinating complex plan of care that includes discussion with multiple specialists as well as arranging for ongoing treatment.

## 2020-06-12 NOTE — ED Provider Notes (Signed)
[facility] EMERGENCY DEPARTMENT Provider Note   CSN: [id number] Arrival date & time: 06/11/20  1405     History Chief Complaint  Patient presents with  . Dizziness    Catherine Martinez is a 34 y.o. female.  34 year old female with a history of HIV on antiretrovirals who presents to the emergency department today for dizziness.  Patient states that she was diagnosed with pyelonephritis and started ciprofloxacin on Wednesday.  She states 24 hours or so she is developed some lightheadedness and feel like she is going to pass out.  She also had a frontal headache that is new for her.  No neurologic changes.  No rashes.  No abdominal pain.  No other associated symptoms.        Past Medical History:  Diagnosis Date  . Anemia    pregnant  . History of stillbirth   . HIV (human immunodeficiency virus infection) (HCC)   . Kidney infection    Recurrent hx  . Kidney infection   . SVD (spontaneous vaginal delivery)    x 4    Patient Active Problem List   Diagnosis Date Noted  . Late prenatal care complicating pregnancy 12/14/2012  . Previous stillbirth or demise, antepartum 12/14/2012  . CHLAMYDIAL INFECTION, HX OF 04/12/2010  . HIV INFECTION 03/29/2010    Past Surgical History:  Procedure Laterality Date  . CESAREAN SECTION WITH BILATERAL TUBAL LIGATION N/A 12/28/2012   Procedure: CESAREAN SECTION WITH BILATERAL TUBAL LIGATION;  Surgeon: [name], MD;  Location: [facility];  Service: Obstetrics;  Laterality: N/A;  AZT prior to section  . TOOTH EXTRACTION N/A 08/25/2015   Procedure: MULTIPLE EXTRACTIONS TEETH # 1, 2, 3, 4, 5, 6, 7, 8, 9, 10, 11, 12, 13, 14, 15, 16, 17, 18, 31, 32 AND ALVEOLOPLASTY;  Surgeon: [name], DDS;  Location: [facility];  Service: Oral Surgery;  Laterality: N/A;     OB History    Gravida  5   Para  5   Term  5   Preterm      AB      Living  4     SAB      TAB      Ectopic      Multiple      Live Births  1            Family History  Problem Relation Age of Onset  . Diabetes Mother   . Diabetes Maternal Aunt   . Diabetes Maternal Grandfather   . Hyperlipidemia Maternal Grandfather   . Other Neg Hx     Social History   Tobacco Use  . Smoking status: Former Smoker    Packs/day: 1.00    Years: 1.00    Pack years: 1.00    Types: Cigarettes    Quit date: 10/21/2009    Years since quitting: 10.6  . Smokeless tobacco: Never Used  Substance Use Topics  . Alcohol use: No  . Drug use: No    Home Medications Prior to Admission medications   Medication Sig Start Date End Date Taking? Authorizing Provider  ciprofloxacin (CIPRO) 500 MG tablet Take 500 mg by mouth 2 (two) times daily. 06/06/20  Yes [provider]  Dolutegravir Sodium (TIVICAY) 50 MG TABS Take 1 tablet (50 mg total) by mouth daily. 02/03/13  Yes Daiva Eves, Lisette Grinder, MD  acyclovir (ZOVIRAX) 400 MG tablet Take 2 tablets (800 mg total) by mouth 5 (five) times daily. X 5 days  Patient not taking: Reported on 06/12/2020 03/25/13   Trixie Dredge, PA-C  ibuprofen (ADVIL) 600 MG tablet Take 1 tablet (600 mg total) by mouth every 8 (eight) hours as needed. Patient not taking: Reported on 06/12/2020 08/07/19   Linwood Dibbles, MD  valACYclovir (VALTREX) 1000 MG tablet Take 1 tablet (1,000 mg total) by mouth 3 (three) times daily. Patient not taking: Reported on 06/12/2020 08/07/19   Linwood Dibbles, MD    Allergies    Sulfamethoxazole-trimethoprim  Review of Systems   Review of Systems  All other systems reviewed and are negative.   Physical Exam Updated Vital Signs BP 122/88   Pulse 91   Temp 98.2 F (36.8 C) (Oral)   Resp (!) 22   Wt 86.2 kg   LMP 06/09/2020   SpO2 100%   BMI 32.61 kg/m   Physical Exam Vitals and nursing note reviewed.  Constitutional:      Appearance: She is well-developed.  HENT:     Head: Normocephalic and atraumatic.     Nose: Nose normal. No congestion or rhinorrhea.     Mouth/Throat:     Mouth: Mucous  membranes are moist.  Eyes:     Pupils: Pupils are equal, round, and reactive to light.  Cardiovascular:     Rate and Rhythm: Normal rate and regular rhythm.  Pulmonary:     Effort: No respiratory distress.     Breath sounds: No stridor.  Abdominal:     General: Abdomen is flat. There is no distension.  Musculoskeletal:        General: Normal range of motion.     Cervical back: Normal range of motion.  Skin:    General: Skin is warm and dry.     Findings: No rash.  Neurological:     General: No focal deficit present.     Mental Status: She is alert.     ED Results / Procedures / Treatments   Labs (all labs ordered are listed, but only abnormal results are displayed) Labs Reviewed  BASIC METABOLIC PANEL - Abnormal; Notable for the following components:      Result Value   CO2 21 (*)    Glucose, Bld 115 (*)    Creatinine, Ser 1.19 (*)    Calcium 8.6 (*)    GFR calc non Af Amer 59 (*)    All other components within normal limits  CBC - Abnormal; Notable for the following components:   RBC 3.47 (*)    Hemoglobin 8.2 (*)    HCT 25.7 (*)    MCV 74.1 (*)    MCH 23.6 (*)    RDW 19.1 (*)    Platelets 6 (*)    All other components within normal limits  CBC WITH DIFFERENTIAL/PLATELET - Abnormal; Notable for the following components:   RBC 3.46 (*)    Hemoglobin 8.2 (*)    HCT 25.7 (*)    MCV 74.3 (*)    MCH 23.7 (*)    RDW 19.1 (*)    Platelets 6 (*)    Abs Immature Granulocytes 0.22 (*)    All other components within normal limits  HEPATIC FUNCTION PANEL - Abnormal; Notable for the following components:   Total Protein 8.7 (*)    Albumin 3.4 (*)    AST 76 (*)    Total Bilirubin 7.3 (*)    Bilirubin, Direct 1.2 (*)    Indirect Bilirubin 6.1 (*)    All other components within normal limits  DIC (DISSEMINATED  INTRAVASCULAR COAGULATION) PANEL (NOT AT [facility]) - Abnormal; Notable for the following components:   Prothrombin Time 15.4 (*)    INR 1.3 (*)    D-Dimer, Quant  13.81 (*)    Platelets 6 (*)    All other components within normal limits  URINALYSIS, ROUTINE W REFLEX MICROSCOPIC - Abnormal; Notable for the following components:   Color, Urine RED (*)    APPearance TURBID (*)    Glucose, UA   (*)    Value: TEST NOT REPORTED DUE TO COLOR INTERFERENCE OF URINE PIGMENT   Hgb urine dipstick   (*)    Value: TEST NOT REPORTED DUE TO COLOR INTERFERENCE OF URINE PIGMENT   Bilirubin Urine   (*)    Value: TEST NOT REPORTED DUE TO COLOR INTERFERENCE OF URINE PIGMENT   Ketones, ur   (*)    Value: TEST NOT REPORTED DUE TO COLOR INTERFERENCE OF URINE PIGMENT   Protein, ur   (*)    Value: TEST NOT REPORTED DUE TO COLOR INTERFERENCE OF URINE PIGMENT   Nitrite   (*)    Value: TEST NOT REPORTED DUE TO COLOR INTERFERENCE OF URINE PIGMENT   Leukocytes,Ua   (*)    Value: TEST NOT REPORTED DUE TO COLOR INTERFERENCE OF URINE PIGMENT   RBC / HPF >50 (*)    WBC, UA >50 (*)    Bacteria, UA MANY (*)    Squamous Epithelial / LPF >50 (*)    Trichomonas, UA PRESENT (*)    All other components within normal limits  LACTATE DEHYDROGENASE - Abnormal; Notable for the following components:   LDH 1,410 (*)    All other components within normal limits  TROPONIN I (HIGH SENSITIVITY) - Abnormal; Notable for the following components:   Troponin I (High Sensitivity) 201 (*)    All other components within normal limits  URINALYSIS, ROUTINE W REFLEX MICROSCOPIC  SAVE SMEAR ([facility])  I-STAT BETA HCG BLOOD, ED ([facility], [facility], [facility] ONLY)  TYPE AND SCREEN  TROPONIN I (HIGH SENSITIVITY)    EKG EKG Interpretation  Date/Time:  Sunday June 11 2020 14:15:23 EDT Ventricular Rate:  101 PR Interval:    QRS Duration: 72 QT Interval:  314 QTC Calculation: 407 R Axis:   69 Text Interpretation: Sinus tachycardia T wave abnormality, consider inferior ischemia T wave abnormality, consider anterolateral ischemia Abnormal ECG new TWI Confirmed by Marily Memos [phone]) on 06/12/2020 1:09:57  AM   Radiology CT Head Wo Contrast  Result Date: 06/12/2020 CLINICAL DATA:  Dizziness. Hypotension. Cerebral hemorrhage suspected. EXAM: CT HEAD WITHOUT CONTRAST TECHNIQUE: Contiguous axial images were obtained from the base of the skull through the vertex without intravenous contrast. COMPARISON:  None. FINDINGS: Brain: No acute infarct, hemorrhage, or mass lesion is present. No significant white matter lesions are present. The ventricles are of normal size. No significant extraaxial fluid collection is present. The brainstem and cerebellum are within normal limits. Vascular: No hyperdense vessel or unexpected calcification. Skull: Vertebral body heights and alignment are normal. Calvarium is intact. No focal lytic or blastic lesions are present. No significant extracranial soft tissue lesion is present. Sinuses/Orbits: The paranasal sinuses and mastoid air cells are clear. The globes and orbits are within normal limits. IMPRESSION: Negative CT of the head. Electronically Signed   By: Marin Roberts M.D.   On: 06/12/2020 04:43    Procedures .Critical Care Performed by: Marily Memos, MD Authorized by: Marily Memos, MD   Critical care provider statement:    Critical care  time (minutes):  45   Critical care was necessary to treat or prevent imminent or life-threatening deterioration of the following conditions:  Circulatory failure   Critical care was time spent personally by me on the following activities:  Discussions with consultants, evaluation of patient's response to treatment, examination of patient, ordering and performing treatments and interventions, ordering and review of laboratory studies, ordering and review of radiographic studies, pulse oximetry, re-evaluation of patient's condition, obtaining history from patient or surrogate and review of old charts   (including critical care time)  Medications Ordered in ED Medications  sodium chloride flush (NS) 0.9 % injection 3 mL  (has no administration in time range)    ED Course  I have reviewed the triage vital signs and the nursing notes.  Pertinent labs & imaging results that were available during my care of the patient were reviewed by me and considered in my medical decision making (see chart for details).    MDM Rules/Calculators/A&P                          TTP v DIC v ITP. Discussed w/ Dr. Clelia CroftShadad with oncology, recommends LDH, smear then review for diagnosis.  Discussed with medicine, requests ICU evaluation.  Also with TWI and elevated troponin. No cp. Likely related to demand. With anemia and thrombocytopenia will hold on heparin until oncology evaluation.  Headache with thrombocytopenia: neuro exam normal, head ct normal, likely unrelated.  icu evaluated, ok for medicine admission. Repaged.  D/w Dr. Katrinka BlazingSmith, will admit. abx per primary team.   Final Clinical Impression(s) / ED Diagnoses Final diagnoses:  T.T.P. syndrome (HCC)    Rx / DC Orders ED Discharge Orders    None       Stacy Sailer, Barbara CowerJason, MD 06/12/20 2349

## 2020-06-12 NOTE — Consult Note (Signed)
Called to evaluate patient for need for ICU.   Catherine Martinez is a 34 yo woman with HIV, recent UTI, here with dizziness/lightheadedness, found to be thrombocytopenic.    She is otherwise stable.   She is resting in bed, speaking on the phone, NAD.  Feels ok now.  Neuro exam WNL.   Seen by oncology early this am.  Concern for TTP.  Work up pending.  Plan for prednisone and plasmapheresis.  IR to place line, plasmapheresis to be done in HD unit.   She appears to be stable for admission to medical ward.  Please feel free to call us back to discuss if needed.

## 2020-06-12 NOTE — ED Notes (Signed)
Per lab tech, LDH and save spear to be added on to blood in lab

## 2020-06-13 DIAGNOSIS — M311 Thrombotic microangiopathy: Principal | ICD-10-CM

## 2020-06-13 LAB — THERAPEUTIC PLASMA EXCHANGE (BLOOD BANK)
Plasma Exchange: 3100
Plasma volume needed: 3100
Unit division: 0
Unit division: 0
Unit division: 0
Unit division: 0
Unit division: 0
Unit division: 0
Unit division: 0

## 2020-06-13 LAB — COMPREHENSIVE METABOLIC PANEL
ALT: 27 U/L (ref 0–44)
AST: 40 U/L (ref 15–41)
Albumin: 3.4 g/dL — ABNORMAL LOW (ref 3.5–5.0)
Alkaline Phosphatase: 46 U/L (ref 38–126)
Anion gap: 11 (ref 5–15)
BUN: 18 mg/dL (ref 6–20)
CO2: 27 mmol/L (ref 22–32)
Calcium: 9.4 mg/dL (ref 8.9–10.3)
Chloride: 102 mmol/L (ref 98–111)
Creatinine, Ser: 1.11 mg/dL — ABNORMAL HIGH (ref 0.44–1.00)
GFR calc Af Amer: >60 mL/min (ref >60)
GFR calc non Af Amer: >60 mL/min (ref >60)
Glucose, Bld: 109 mg/dL — ABNORMAL HIGH (ref 70–99)
Potassium: 3.5 mmol/L (ref 3.5–5.1)
Sodium: 140 mmol/L (ref 135–145)
Total Bilirubin: 3.7 mg/dL — ABNORMAL HIGH (ref 0.3–1.2)
Total Protein: 6.9 g/dL (ref 6.5–8.1)

## 2020-06-13 LAB — CBC
HCT: 26.2 % — ABNORMAL LOW (ref 36.0–46.0)
Hemoglobin: 8.5 g/dL — ABNORMAL LOW (ref 12.0–15.0)
MCH: 23.7 pg — ABNORMAL LOW (ref 26.0–34.0)
MCHC: 32.4 g/dL (ref 30.0–36.0)
MCV: 73.2 fL — ABNORMAL LOW (ref 80.0–100.0)
Platelets: 26 10*3/uL — CL (ref 150–400)
RBC: 3.58 MIL/uL — ABNORMAL LOW (ref 3.87–5.11)
RDW: 19.9 % — ABNORMAL HIGH (ref 11.5–15.5)
WBC: 6.8 10*3/uL (ref 4.0–10.5)
nRBC: 0.4 % — ABNORMAL HIGH (ref 0.0–0.2)

## 2020-06-13 LAB — LACTATE DEHYDROGENASE: LDH: 551 U/L — ABNORMAL HIGH (ref 98–192)

## 2020-06-13 LAB — T-HELPER CELLS (CD4) COUNT (NOT AT ARMC)
CD4 % Helper T Cell: 10 % — ABNORMAL LOW (ref 33–65)
CD4 T Cell Abs: 209 /uL — ABNORMAL LOW (ref 400–1790)

## 2020-06-13 LAB — HIV-1 RNA QUANT-NO REFLEX-BLD
HIV 1 RNA Quant: 16200 copies/mL
LOG10 HIV-1 RNA: 4.21 log10copy/mL

## 2020-06-13 LAB — DIRECT ANTIGLOBULIN TEST (NOT AT ARMC)
DAT, IgG: NEGATIVE
DAT, complement: NEGATIVE

## 2020-06-13 MED ORDER — ACD FORMULA A 0.73-2.45-2.2 GM/100ML VI SOLN
Status: AC
  Filled 2020-06-13: qty 500

## 2020-06-13 MED ORDER — CALCIUM CARBONATE ANTACID 500 MG PO CHEW
CHEWABLE_TABLET | ORAL | Status: AC
  Administered 2020-06-13: 400 mg
  Filled 2020-06-13: qty 2

## 2020-06-13 MED ORDER — ACD FORMULA A 0.73-2.45-2.2 GM/100ML VI SOLN
1000.0000 mL | Status: DC
  Administered 2020-06-13: 1000 mL

## 2020-06-13 MED ORDER — ANTICOAGULANT SODIUM CITRATE 4% (200MG/5ML) IV SOLN
5.0000 mL | Freq: Once | Status: DC
  Filled 2020-06-13 (×2): qty 5

## 2020-06-13 MED ORDER — DIPHENHYDRAMINE HCL 25 MG PO CAPS: 25.0000 mg | ORAL_CAPSULE | Freq: Four times a day (QID) | ORAL | Status: DC | PRN

## 2020-06-13 MED ORDER — DAPSONE 100 MG PO TABS
100.0000 mg | ORAL_TABLET | Freq: Every day | ORAL | Status: DC
  Administered 2020-06-13 – 2020-06-17 (×5): 100 mg via ORAL
  Filled 2020-06-13 (×5): qty 1

## 2020-06-13 MED ORDER — ACETAMINOPHEN 325 MG PO TABS: 650.0000 mg | ORAL_TABLET | ORAL | Status: DC | PRN

## 2020-06-13 MED ORDER — CALCIUM CARBONATE ANTACID 500 MG PO CHEW: 2.0000 | CHEWABLE_TABLET | ORAL | Status: AC

## 2020-06-13 MED ORDER — SODIUM CHLORIDE 0.9 % IV SOLN
3.0000 g | Freq: Once | INTRAVENOUS | Status: DC
  Administered 2020-06-13: 3 g via INTRAVENOUS
  Filled 2020-06-13 (×2): qty 30

## 2020-06-13 NOTE — Progress Notes (Addendum)
HEMATOLOGY-ONCOLOGY PROGRESS NOTE  SUBJECTIVE: Tolerated plasmapheresis well yesterday. No bleeding reported. Has a mild headache today, but thinks it is due to not sleeping well. Has no other complaints today.  REVIEW OF SYSTEMS:   Constitutional: Denies fevers, chills Eyes: Denies blurriness of vision Ears, nose, mouth, throat, and face: Denies mucositis or sore throat Respiratory: Denies cough, dyspnea or wheezes Cardiovascular: Denies palpitation, chest discomfort Gastrointestinal:  Denies nausea, heartburn or change in bowel habits Skin: Denies abnormal skin rashes Lymphatics: Denies new lymphadenopathy or easy bruising Neurological: reports mild headache Behavioral/Psych: Mood is stable, no new changes  Extremities: No lower extremity edema All other systems were reviewed with the patient and are negative.  I have reviewed the past medical history, past surgical history, social history and family history with the patient and they are unchanged from previous note.   PHYSICAL EXAMINATION:  Vitals:   06/12/20 2033 06/13/20 0749  BP:  111/77  Pulse: 90 90  Resp: 18 17  Temp:  98 F (36.7 C)  SpO2: 100% 100%   Filed Weights   06/11/20 1422 06/12/20 0754  Weight: 86.2 kg 86.2 kg    Intake/Output from previous day: 08/23 0701 - 08/24 0700 In: 1705.9 [I.V.:1.2; Blood:416.7; IV Piggyback:1288.1] Out: -   GENERAL:alert, no distress and comfortable SKIN: skin color, texture, turgor are normal, no rashes or significant lesions EYES: normal, Conjunctiva are pink and non-injected, sclera clear OROPHARYNX:no exudate, no erythema and lips, buccal mucosa, and tongue normal  LUNGS: clear to auscultation and percussion with normal breathing effort HEART: regular rate & rhythm and no murmurs and no lower extremity edema ABDOMEN:abdomen soft, non-tender and normal bowel sounds NEURO: alert & oriented x 3 with fluent speech, no focal motor/sensory deficits  LABORATORY DATA:  I  have reviewed the data as listed CMP Latest Ref Rng & Units 06/13/2020 06/12/2020 06/11/2020  Glucose 70 - 99 mg/dL 009(Q) 330(Q) 762(U)  BUN 6 - 20 mg/dL 18 20 20   Creatinine 0.44 - 1.00 mg/dL ) 6.33(H) 5.45(G)  Sodium 135 - 145 mmol/L 140 135 135  Potassium 3.5 - 5.1 mmol/L 3.5 3.7 3.9  Chloride 98 - 111 mmol/L 102 104 105  CO2 22 - 32 mmol/L 27 19(L) 21(L)  Calcium 8.9 - 10.3 mg/dL 9.4 2.56(L) 8.9(H)  Total Protein 6.5 - 8.1 g/dL 6.9 7.3(S) -  Total Bilirubin 0.3 - 1.2 mg/dL 2.8(J) 7.3(H) -  Alkaline Phos 38 - 126 U/L 46 54 -  AST 15 - 41 U/L 40 76(H) -  ALT 0 - 44 U/L 27 44 -    Lab Results  Component Value Date   WBC 6.8 06/13/2020   HGB 8.5 (L) 06/13/2020   HCT 26.2 (L) 06/13/2020   MCV 73.2 (L) 06/13/2020   PLT 26 (LL) 06/13/2020   NEUTROABS 2.9 06/11/2020    CT Head Wo Contrast  Result Date: 06/12/2020 CLINICAL DATA:  Dizziness. Hypotension. Cerebral hemorrhage suspected. EXAM: CT HEAD WITHOUT CONTRAST TECHNIQUE: Contiguous axial images were obtained from the base of the skull through the vertex without intravenous contrast. COMPARISON:  None. FINDINGS: Brain: No acute infarct, hemorrhage, or mass lesion is present. No significant white matter lesions are present. The ventricles are of normal size. No significant extraaxial fluid collection is present. The brainstem and cerebellum are within normal limits. Vascular: No hyperdense vessel or unexpected calcification. Skull: Vertebral body heights and alignment are normal. Calvarium is intact. No focal lytic or blastic lesions are present. No significant extracranial soft tissue lesion is present.  Sinuses/Orbits: The paranasal sinuses and mastoid air cells are clear. The globes and orbits are within normal limits. IMPRESSION: Negative CT of the head. Electronically Signed   By: Marin Roberts M.D.   On: 06/12/2020 04:43   IR Fluoro Guide CV Line Right  Result Date: 06/12/2020 CLINICAL DATA:  TTP, needs venous access for  pheresis EXAM: EXAM RIGHT IJ CATHETER PLACEMENT UNDER ULTRASOUND AND FLUOROSCOPIC GUIDANCE TECHNIQUE: The procedure, risks (including but not limited to bleeding, infection, organ damage, pneumothorax), benefits, and alternatives were explained to the patient. Questions regarding the procedure were encouraged and answered. The patient understands and consents to the procedure. Patency of the right IJ vein was confirmed with ultrasound with image documentation. An appropriate skin site was determined. Skin site was marked. Region was prepped using maximum barrier technique including cap and mask, sterile gown, sterile gloves, large sterile sheet, and Chlorhexidine as cutaneous antisepsis. The region was infiltrated locally with 1% lidocaine. Under real-time ultrasound guidance, the right IJ vein was accessed with a 19 gauge needle; the needle tip within the vein was confirmed with ultrasound image documentation. The needle exchanged over a guidewire for vascular dilator which allowed advancement of a 16 cm Trialysis catheter. This was positioned with the tip at the cavoatrial junction. Spot chest radiograph shows good positioning and no pneumothorax. Catheter was flushed and sutured externally with 0-Prolene sutures. Patient tolerated the procedure well. FLUOROSCOPY TIME:  6 seconds, less than 0.1 mGy COMPLICATIONS: COMPLICATIONS none IMPRESSION: 1. Technically successful right IJ Trialysis catheter placement. Electronically Signed   By: Corlis Leak M.D.   On: 06/12/2020 14:12   IR US Guide Vasc Access Right  Result Date: 06/12/2020 CLINICAL DATA:  TTP, needs venous access for pheresis EXAM: EXAM RIGHT IJ CATHETER PLACEMENT UNDER ULTRASOUND AND FLUOROSCOPIC GUIDANCE TECHNIQUE: The procedure, risks (including but not limited to bleeding, infection, organ damage, pneumothorax), benefits, and alternatives were explained to the patient. Questions regarding the procedure were encouraged and answered. The patient  understands and consents to the procedure. Patency of the right IJ vein was confirmed with ultrasound with image documentation. An appropriate skin site was determined. Skin site was marked. Region was prepped using maximum barrier technique including cap and mask, sterile gown, sterile gloves, large sterile sheet, and Chlorhexidine as cutaneous antisepsis. The region was infiltrated locally with 1% lidocaine. Under real-time ultrasound guidance, the right IJ vein was accessed with a 19 gauge needle; the needle tip within the vein was confirmed with ultrasound image documentation. The needle exchanged over a guidewire for vascular dilator which allowed advancement of a 16 cm Trialysis catheter. This was positioned with the tip at the cavoatrial junction. Spot chest radiograph shows good positioning and no pneumothorax. Catheter was flushed and sutured externally with 0-Prolene sutures. Patient tolerated the procedure well. FLUOROSCOPY TIME:  6 seconds, less than 0.1 mGy COMPLICATIONS: COMPLICATIONS none IMPRESSION: 1. Technically successful right IJ Trialysis catheter placement. Electronically Signed   By: Corlis Leak M.D.   On: 06/12/2020 14:12    ASSESSMENT AND PLAN: 1.  Thrombocytopenia suspicious for TTP after presenting with a platelet count of 6000, schistocytes in her peripheral blood smear, and elevated LDH.  Other considerations would be autoimmune thrombocytopenia with autoimmune hemolysis, but considered less likely.  Other considerations including DIC, HUS, HIT, and vaccine induced thrombocytopenia are considered less likely.  Coombs negative. Haptoglobin and ADAMTS13 activity are pending.  Given the high index suspicion for TTP, she is undergoing plasma exchange while completing her work-up.  She  is also on prednisone 60 mg daily.  Plan is for daily plasmapheresis until platelet recovery.  No platelet transfusion will be given unless she develops active bleeding.  Platelet count has  improved to 26,000 and LDH is down to 551 today.  Recommend daily monitoring of LDH and CBC.  2.  Anemia: Multifactorial.  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  Iron panel has been checked.  Ferritin and percent saturation are elevated, iron, TIBC, and UIBC are normal.  She received 1 unit PRBCs on 06/12/2020 in anticipation of further drop of her hemoglobin after plasma exchange.  Hemoglobin is stable today at 8.5.  3.  IV access: Temporary pheresis catheter placed on 06/12/2020.  IR planning to place tunneled catheter in the near future.  4.  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  Management per primary team.  5.  HIV: Infectious disease has consulted on the patient.  HIV regimen changed to Tivicay plus Descovy.  CD4 T cell absolute and CD4% Helper T Cell low. HIV viral load, and genotype are pending.   LOS: 1 day   Clenton Pare, DNP, AGPCNP-BC, AOCNP 06/13/20  Patient seen and examined today.  She has tolerated plasmapheresis without any complications.  Her platelet count did improve with LDH decreased by over 50%.  I recommended continuing daily plasma exchange till her platelet count is back to normal.  Anticipate she will require plasma exchange throughout the next 5 to 6 days.

## 2020-06-13 NOTE — Progress Notes (Signed)
[facility]  Date of Admission:  06/11/2020     Total days of antibiotics 2         ASSESSMENT:  Catherine Martinez has poorly controlled HIV disease with less than optimal adherence to her ART regimen of Triumeq which was confirmed with her today. CD4 count is 209 with CD4 percentage of 10% with viral load pending. Catherine Martinez has no signs/symptoms of opportunistic infection but remains at risk with lower CD % and will start her on Dapsone for PJP prophylaxis. Discussed importance of taking medication as prescribed. Will get re-established with [facility] clinic and financial assistance. Continue current dose of Tivicay and Descovy. Oncology managing TTP with plasmapheresis.   PLAN:  1. Continue current dose of Tivicay and Descovy. 2. Start Dapsone.  3. Will establish with financial counselors at [facility]  4. TTP management per Oncology.   Principal Problem:   TTP (thrombotic thrombocytopenic purpura) (HCC) Active Problems:   HIV INFECTION   Trichomonas infection   Transaminitis   Acute lower UTI   Acute kidney injury superimposed on CKD (HCC)   Microcytic hypochromic anemia   Hyperbilirubinemia   . calcium carbonate  2 tablet Oral Q3H  . Chlorhexidine Gluconate Cloth  6 each Topical Daily  . dolutegravir  50 mg Oral Daily  . emtricitabine-tenofovir AF  1 tablet Oral Daily  . predniSONE  80 mg Oral Q breakfast  . sodium chloride flush  3 mL Intravenous Once  . sodium chloride flush  3 mL Intravenous Q12H    SUBJECTIVE:  Afebrile overnight with no acute events. Feeling better today. Was previously on Triumeq which was identified by name and picture. Less than optimal adherence to previous regimen. Denies fevers, chills, night sweats, headaches, changes in vision, neck pain/stiffness, nausea, diarrhea, vomiting, lesions or rashes.   Allergies  Allergen Reactions  . Sulfamethoxazole-Trimethoprim Hives     Review of Systems: Review of Systems  Constitutional:  Negative for chills, fever and weight loss.  Eyes: Negative for blurred vision, double vision and photophobia.  Respiratory: Negative for cough, shortness of breath and wheezing.   Cardiovascular: Negative for chest pain and leg swelling.  Gastrointestinal: Negative for abdominal pain, constipation, diarrhea, nausea and vomiting.  Skin: Negative for rash.  Neurological: Negative for headaches.      OBJECTIVE: Vitals:   06/12/20 1835 06/12/20 1948 06/12/20 2033 06/13/20 0749  BP: 122/85 (!) 128/96  111/77  Pulse: (!) 109 97 90 90  Resp: 17 19 18 17   Temp: 98.3 F (36.8 C) 98.6 F (37 C)  98 F (36.7 C)  TempSrc: Oral Oral  Oral  SpO2: 99% 100% 100% 100%  Weight:      Height:       Body mass index is 32.61 kg/m.  Physical Exam Constitutional:      General: Catherine Martinez is not in acute distress.    Appearance: Catherine Martinez is well-developed.     Comments: Lying in bed with head of bed elevated; pleasant.   Cardiovascular:     Rate and Rhythm: Normal rate and regular rhythm.     Heart sounds: Normal heart sounds.  Pulmonary:     Effort: Pulmonary effort is normal.     Breath sounds: Normal breath sounds.  Skin:    General: Skin is warm and dry.  Neurological:     Mental Status: Catherine Martinez is alert and oriented to person, place, and time.  Psychiatric:        Behavior: Behavior normal.  Thought Content: Thought content normal.        Judgment: Judgment normal.     Lab Results Lab Results  Component Value Date   WBC 6.8 06/13/2020   HGB 8.5 (L) 06/13/2020   HCT 26.2 (L) 06/13/2020   MCV 73.2 (L) 06/13/2020   PLT 26 (LL) 06/13/2020    Lab Results  Component Value Date   CREATININE 1.11 (H) 06/13/2020   BUN 18 06/13/2020   NA 140 06/13/2020   K 3.5 06/13/2020   CL 102 06/13/2020   CO2 27 06/13/2020    Lab Results  Component Value Date   ALT 27 06/13/2020   AST 40 06/13/2020   ALKPHOS 46 06/13/2020   BILITOT 3.7 (H) 06/13/2020     Microbiology: Recent Results (from  the past 240 hour(s))  SARS Coronavirus 2 by RT PCR (hospital order, performed in [facility] hospital lab) Nasopharyngeal Nasopharyngeal Swab     Status: None   Collection Time: 06/12/20  8:03 AM   Specimen: Nasopharyngeal Swab  Result Value Ref Range Status   SARS Coronavirus 2 NEGATIVE NEGATIVE Final    Comment: (NOTE) SARS-CoV-2 target nucleic acids are NOT DETECTED.  The SARS-CoV-2 RNA is generally detectable in upper and lower respiratory specimens during the acute phase of infection. The lowest concentration of SARS-CoV-2 viral copies this assay can detect is 250 copies / mL. A negative result does not preclude SARS-CoV-2 infection and should not be used as the sole basis for treatment or other patient management decisions.  A negative result may occur with improper specimen collection / handling, submission of specimen other than nasopharyngeal swab, presence of viral mutation(s) within the areas targeted by this assay, and inadequate number of viral copies (<250 copies / mL). A negative result must be combined with clinical observations, patient history, and epidemiological information.  Fact Sheet for Patients:   BoilerBrush.com.cy  Fact Sheet for Healthcare Providers: https://pope.com/  This test is not yet approved or  cleared by the [place] FDA and has been authorized for detection and/or diagnosis of SARS-CoV-2 by FDA under an Emergency Use Authorization (EUA).  This EUA will remain in effect (meaning this test can be used) for the duration of the COVID-19 declaration under Section 564(b)(1) of the Act, 21 U.S.C. section 360bbb-3(b)(1), unless the authorization is terminated or revoked sooner.  Performed at [facility], [address] [place], [place] [address]      Marcos Eke, NP [facility] [address] [facility]  06/13/2020  1:58 PM

## 2020-06-13 NOTE — Progress Notes (Signed)
PROGRESS NOTE    Catherine Martinez  AOZ:[id number]  DOB: 03-25-1986  PCP: Patient, No Pcp Per Admit date:06/11/2020 Chief compliant: dizziness 34 y.o. female with medical history significant of HIV, UTIs, and anemia presents with complaints of dizziness.  Last week she had noticed a strong odor to her urine and was having back pain.  She was evaluated 5 days ago and found to have signs of a urinary tract infection/pyelonephritis started on ciprofloxacin.  Last urinary tract infection was over a year ago.  She has been taking the medication as advised and only gets nauseous with it.  She reports that the back pain had been getting better.  However, after taking a dose of the medicine yesterday morning felt as though she may pass out.  Noted associated symptoms of a headache, but that has since resolved.She came to the emergency department for further evaluation as symptoms persisted.  ED Course: Afebrile,vital signs maintained.  Labs significant for WBC 5.1, hemoglobin 8.2 with low MCV/MCH, platelets 6, total protein 8.7, total bilirubin 7.3, D-dimer 13.81, INR 1.3, PT 15.4, and LDH 1410.  Peripheral smear had showed signs of schistocytes.  Oncology have been consulted ordered further work-up for TTP.  IR has been consulted to place a temporary pheresis catheter to start  plasma exchange.  Case had been discussed with PCCM recommended general medicine admission.   Hospital course: Patient admitted to [facility] for further evaluation and management with hematology and infectious diseases consultations.   Subjective:  Patient resting comfortably and talking over the phone.  Denies any acute complaints.  Denies any bleeding signs like epistaxis, melena or hematochezia or hematemesis or hematuria or vaginal bleeding.  Underwent plasmapheresis yesterday and tolerated well.  Has small amount of blood around left arm IV site.  Objective: Vitals:   06/12/20 1835 06/12/20 1948 06/12/20 2033 06/13/20 0749  BP: 122/85  (!) 128/96  111/77  Pulse: (!) 109 97 90 90  Resp: 17 19 18 17   Temp: 98.3 F (36.8 C) 98.6 F (37 C)  98 F (36.7 C)  TempSrc: Oral Oral  Oral  SpO2: 99% 100% 100% 100%  Weight:      Height:        Intake/Output Summary (Last 24 hours) at 06/13/2020 1235 Last data filed at 06/13/2020 0900 Gross per 24 hour  Intake 1220 ml  Output --  Net 1220 ml   Filed Weights   06/11/20 1422 06/12/20 0754  Weight: 86.2 kg 86.2 kg    Physical Examination:  General: Moderately built, no acute distress noted Head ENT: Atraumatic normocephalic, PERRLA, neck supple Heart:, Tunneled catheter noted along right neck.  S1-S2 heard, regular rate and rhythm, no murmurs.  No leg edema noted Lungs: Equal air entry bilaterally, no rhonchi or rales on exam, no accessory muscle use Abdomen: Bowel sounds heard, soft, nontender, nondistended. No organomegaly.  No CVA tenderness Extremities: No pedal edema.  No cyanosis or clubbing. Neurological: Awake alert oriented x3, no focal weakness or numbness, strength and sensations to crude touch intact Skin: No wounds or petechiaL rashes.   Data Reviewed: I have personally reviewed following labs and imaging studies  CBC: Recent Labs  Lab 06/11/20 1421 06/11/20 1622 06/12/20 0232 06/13/20 0646  WBC 5.1 5.4  --  6.8  NEUTROABS  --  2.9  --   --   HGB 8.2* 8.2*  --  8.5*  HCT 25.7* 25.7*  --  26.2*  MCV 74.1* 74.3*  --  73.2*  PLT 6*  6* 6* 26*   Basic Metabolic Panel: Recent Labs  Lab 06/11/20 1421 06/12/20 0803 06/13/20 0646  NA 135 135 140  K 3.9 3.7 3.5  CL 105 104 102  CO2 21* 19* 27  GLUCOSE 115* 103* 109*  BUN 20 20 18   CREATININE 1.19* 1.13* 1.11*  CALCIUM 8.6* 8.7* 9.4   GFR: Estimated Creatinine Clearance: 75.2 mL/min (A) (by C-G formula based on SCr of 1.11 mg/dL (H)). Liver Function Tests: Recent Labs  Lab 06/12/20 0232 06/13/20 0646  AST 76* 40  ALT 44 27  ALKPHOS 54 46  BILITOT 7.3* 3.7*  PROT 8.7* 6.9  ALBUMIN 3.4*  3.4*   No results for input(s): LIPASE, AMYLASE in the last 168 hours. No results for input(s): AMMONIA in the last 168 hours. Coagulation Profile: Recent Labs  Lab 06/12/20 0232  INR 1.3*   Cardiac Enzymes: No results for input(s): CKTOTAL, CKMB, CKMBINDEX, TROPONINI in the last 168 hours. BNP (last 3 results) No results for input(s): PROBNP in the last 8760 hours. HbA1C: No results for input(s): HGBA1C in the last 72 hours. CBG: No results for input(s): GLUCAP in the last 168 hours. Lipid Profile: No results for input(s): CHOL, HDL, LDLCALC, TRIG, CHOLHDL, LDLDIRECT in the last 72 hours. Thyroid Function Tests: No results for input(s): TSH, T4TOTAL, FREET4, T3FREE, THYROIDAB in the last 72 hours. Anemia Panel: Recent Labs    06/12/20 0803  VITAMINB12 479  FOLATE 13.8  FERRITIN 910*  TIBC 304  IRON 154  RETICCTPCT 2.8   Sepsis Labs: No results for input(s): PROCALCITON, LATICACIDVEN in the last 168 hours.  Recent Results (from the past 240 hour(s))  SARS Coronavirus 2 by RT PCR (hospital order, performed in [facility] hospital lab) Nasopharyngeal Nasopharyngeal Swab     Status: None   Collection Time: 06/12/20  8:03 AM   Specimen: Nasopharyngeal Swab  Result Value Ref Range Status   SARS Coronavirus 2 NEGATIVE NEGATIVE Final    Comment: (NOTE) SARS-CoV-2 target nucleic acids are NOT DETECTED.  The SARS-CoV-2 RNA is generally detectable in upper and lower respiratory specimens during the acute phase of infection. The lowest concentration of SARS-CoV-2 viral copies this assay can detect is 250 copies / mL. A negative result does not preclude SARS-CoV-2 infection and should not be used as the sole basis for treatment or other patient management decisions.  A negative result may occur with improper specimen collection / handling, submission of specimen other than nasopharyngeal swab, presence of viral mutation(s) within the areas targeted by this assay, and  inadequate number of viral copies (<250 copies / mL). A negative result must be combined with clinical observations, patient history, and epidemiological information.  Fact Sheet for Patients:   06/14/20  Fact Sheet for Healthcare Providers: BoilerBrush.com.cy  This test is not yet approved or  cleared by the https://pope.com/ FDA and has been authorized for detection and/or diagnosis of SARS-CoV-2 by FDA under an Emergency Use Authorization (EUA).  This EUA will remain in effect (meaning this test can be used) for the duration of the COVID-19 declaration under Section 564(b)(1) of the Act, 21 U.S.C. section 360bbb-3(b)(1), unless the authorization is terminated or revoked sooner.  Performed at [facility], [address], [place], [place]       Radiology Studies: CT Head Wo Contrast  Result Date: 06/12/2020 CLINICAL DATA:  Dizziness. Hypotension. Cerebral hemorrhage suspected. EXAM: CT HEAD WITHOUT CONTRAST TECHNIQUE: Contiguous axial images were obtained from the base of  the skull through the vertex without intravenous contrast. COMPARISON:  None. FINDINGS: Brain: No acute infarct, hemorrhage, or mass lesion is present. No significant white matter lesions are present. The ventricles are of normal size. No significant extraaxial fluid collection is present. The brainstem and cerebellum are within normal limits. Vascular: No hyperdense vessel or unexpected calcification. Skull: Vertebral body heights and alignment are normal. Calvarium is intact. No focal lytic or blastic lesions are present. No significant extracranial soft tissue lesion is present. Sinuses/Orbits: The paranasal sinuses and mastoid air cells are clear. The globes and orbits are within normal limits. IMPRESSION: Negative CT of the head. Electronically Signed   By: Marin Roberts M.D.   On: 06/12/2020 04:43   IR Fluoro Guide CV Line Right  Result  Date: 06/12/2020 CLINICAL DATA:  TTP, needs venous access for pheresis EXAM: EXAM RIGHT IJ CATHETER PLACEMENT UNDER ULTRASOUND AND FLUOROSCOPIC GUIDANCE TECHNIQUE: The procedure, risks (including but not limited to bleeding, infection, organ damage, pneumothorax), benefits, and alternatives were explained to the patient. Questions regarding the procedure were encouraged and answered. The patient understands and consents to the procedure. Patency of the right IJ vein was confirmed with ultrasound with image documentation. An appropriate skin site was determined. Skin site was marked. Region was prepped using maximum barrier technique including cap and mask, sterile gown, sterile gloves, large sterile sheet, and Chlorhexidine as cutaneous antisepsis. The region was infiltrated locally with 1% lidocaine. Under real-time ultrasound guidance, the right IJ vein was accessed with a 19 gauge needle; the needle tip within the vein was confirmed with ultrasound image documentation. The needle exchanged over a guidewire for vascular dilator which allowed advancement of a 16 cm Trialysis catheter. This was positioned with the tip at the cavoatrial junction. Spot chest radiograph shows good positioning and no pneumothorax. Catheter was flushed and sutured externally with 0-Prolene sutures. Patient tolerated the procedure well. FLUOROSCOPY TIME:  6 seconds, less than 0.1 mGy COMPLICATIONS: COMPLICATIONS none IMPRESSION: 1. Technically successful right IJ Trialysis catheter placement. Electronically Signed   By: Corlis Leak M.D.   On: 06/12/2020 14:12   IR US Guide Vasc Access Right  Result Date: 06/12/2020 CLINICAL DATA:  TTP, needs venous access for pheresis EXAM: EXAM RIGHT IJ CATHETER PLACEMENT UNDER ULTRASOUND AND FLUOROSCOPIC GUIDANCE TECHNIQUE: The procedure, risks (including but not limited to bleeding, infection, organ damage, pneumothorax), benefits, and alternatives were explained to the patient. Questions regarding  the procedure were encouraged and answered. The patient understands and consents to the procedure. Patency of the right IJ vein was confirmed with ultrasound with image documentation. An appropriate skin site was determined. Skin site was marked. Region was prepped using maximum barrier technique including cap and mask, sterile gown, sterile gloves, large sterile sheet, and Chlorhexidine as cutaneous antisepsis. The region was infiltrated locally with 1% lidocaine. Under real-time ultrasound guidance, the right IJ vein was accessed with a 19 gauge needle; the needle tip within the vein was confirmed with ultrasound image documentation. The needle exchanged over a guidewire for vascular dilator which allowed advancement of a 16 cm Trialysis catheter. This was positioned with the tip at the cavoatrial junction. Spot chest radiograph shows good positioning and no pneumothorax. Catheter was flushed and sutured externally with 0-Prolene sutures. Patient tolerated the procedure well. FLUOROSCOPY TIME:  6 seconds, less than 0.1 mGy COMPLICATIONS: COMPLICATIONS none IMPRESSION: 1. Technically successful right IJ Trialysis catheter placement. Electronically Signed   By: Corlis Leak M.D.   On: 06/12/2020 14:12  Scheduled Meds: . calcium carbonate  2 tablet Oral Q3H  . Chlorhexidine Gluconate Cloth  6 each Topical Daily  . dolutegravir  50 mg Oral Daily  . emtricitabine-tenofovir AF  1 tablet Oral Daily  . predniSONE  80 mg Oral Q breakfast  . sodium chloride flush  3 mL Intravenous Once  . sodium chloride flush  3 mL Intravenous Q12H   Continuous Infusions: . anticoagulant sodium citrate    . calcium gluconate    . cefTRIAXone (ROCEPHIN)  IV 1 g (06/13/20 0937)  . citrate dextrose    . citrate dextrose    . metronidazole 500 mg (06/13/20 1101)      Assessment/Plan:   TTP: Thrombocytopenia with elevated LDH, indirect bilirubin and mild renal insufficiency.  Underwent right IJ catheter placement by  IR with no complications of bleeding.  Coombs testing, haptoglobin, and ADAMTS13 level sent.  Seen by hematology and patient receiving oral prednisone and plasmapheresis (underwent 1 session yesterday and plan to repeat today).  Fortunately no signs of active bleeding.  Platelet count has improved to 26 today.  HIV disease: Patient admits that she has not been seen by infectious disease in over a year after losing her insurance.  However, she has been taking her antiretroviral medications as prescribed.  Appreciate infectious diseases evaluation and management-medication regimen changed and viral load/CD4 counts sent.  Microcytic anemia: Iron level 154 and percent saturations elevated at 51, normal TIBC with ferritin at 910.  Possibly related to problem #1.  Received 1 unit of PRBC per oncology recommendations yesterday.  Patient apparently was on iron supplements previously.  Monitor for now.  Hyperbilirubinemia: Indirect bilirubin greater than direct indicative of hemolysis.  Elevated LDH also indicates the same.  Appears to be improving slowly.  Abnormal UA: Being treated with IV ceftriaxone for UTI given complaints of dysuria and UA showing pyuria/bacteriuria.  Follow-up urine culture results.  Mild AKI: Patient's baseline creatinine appears to be around 0.8.  Elevated at 1.19 on presentation-continue to monitor, no significant change with IV fluids x1 L.  Troponinemia: Unclear significance.  No complaints of chest pain.  EKG within normal limits.  Will check echocardiogram.  DVT prophylaxis:  Code Status:  Family / Patient Communication:  Disposition Plan:   Status is: Inpatient  Remains inpatient appropriate because:IV treatments appropriate due to intensity of illness or inability to take PO   Dispo: The patient is from: Home              Anticipated d/c is to: Home              Anticipated d/c date is: 3 days              Patient currently is not medically stable to  d/c.           Time spent: 35 MIN     >50% time spent in discussions with care team and coordination of care.    Alessandra BevelsNeelima Giomar Gusler, MD [facility] Pager in [facility]  If 7PM-7AM, please contact night-coverage [email or web address] 06/13/2020, 12:35 PM

## 2020-06-13 NOTE — TOC Initial Note (Signed)
Transition of Care [facility]) - Initial/Assessment Note    Patient Details  Name: Catherine Martinez MRN: [id number] Date of Birth: 08-21-1986  Transition of Care [facility]) CM/SW Contact:    Curlene Labrum, RN Phone Number: 06/13/2020, 2:01 PM  Clinical Narrative:                 Case management met with the patient regarding transitions of care needs and followup with financial counselor and need for PCP.  The patient currently lives at home with four young children ages 36, 83, 49 and 70 years of age - who are currently being cared for by family members while the patient is hospitalized.  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.  She is able to drive herself and get to appointments but has not had a recent follow up with a PCP.  Patient was agreeable to [facility] appointment and would be set up with a follow up appointment.  I called the financial counselor, Shanon Rosser [phone] and she will call the patient and follow up regarding patient's ability to have Medicaid established.  Will continue to follow regarding discharge needs.  Patient will need [facility] pharmacy for discharge medications when medically stable at a later date.  Expected Discharge Plan: Home/Self Care Barriers to Discharge: Inadequate or no insurance   Patient Goals and CMS Choice Patient states their goals for this hospitalization and ongoing recovery are:: Patient plans to discharge home once she is medically stable. CMS Medicare.gov Compare Post Acute Care list provided to:: Patient Choice offered to / list presented to : Patient  Expected Discharge Plan and Services Expected Discharge Plan: Home/Self Care In-house Referral: Development worker, community, PCP / [facility] Connect Discharge Planning Services: CM Consult   Living arrangements for the past 2 months: Apartment                                      Prior Living Arrangements/Services Living arrangements for the  past 2 months: Apartment Lives with:: Minor Children Patient language and need for interpreter reviewed:: Yes Do you feel safe going back to the place where you live?: Yes      Need for Family Participation in Patient Care: Yes (Comment) Care giver support system in place?: Yes (comment)   Criminal Activity/Legal Involvement Pertinent to Current Situation/Hospitalization: No - Comment as needed  Activities of Daily Living      Permission Sought/Granted Permission sought to share information with : Case Manager Permission granted to share information with : Yes, Verbal Permission Granted     Permission granted to share info w AGENCY: [facility],  Development worker, community  Permission granted to share info w Relationship: family contacts     Emotional Assessment Appearance:: Appears stated age Attitude/Demeanor/Rapport: Gracious Affect (typically observed): Accepting Orientation: : Oriented to Self, Oriented to Place, Oriented to  Time, Oriented to Situation Alcohol / Substance Use: Never Used (Patient stopped smoking in 2011) Psych Involvement: No (comment)  Admission diagnosis:  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 Transaminitis 06/12/2020  . Acute lower UTI 06/12/2020  . Acute kidney injury superimposed on CKD ([place]) 06/12/2020  . Microcytic hypochromic anemia 06/12/2020  . Hyperbilirubinemia 06/12/2020  . Late prenatal care complicating pregnancy 26/83/4196  . Previous  stillbirth or demise, antepartum 12/14/2012  . Trichomonas infection 11/18/2012  . CHLAMYDIAL INFECTION, HX OF 04/12/2010  . HIV INFECTION 03/29/2010   PCP:  Patient, No Pcp Per Pharmacy:   [facility] [place], [place] - [address] [place]  [address] Phone: [phone] Fax: [phone]     Social Determinants of Health (SDOH) Interventions    Readmission Risk Interventions Readmission Risk Prevention Plan 06/13/2020  Post Dischage Appt Complete  Medication Screening Complete  Transportation Screening Complete  Some recent data might be hidden

## 2020-06-13 NOTE — Plan of Care (Signed)

## 2020-06-14 ENCOUNTER — Inpatient Hospital Stay (HOSPITAL_COMMUNITY): Payer: Medicaid Other

## 2020-06-14 DIAGNOSIS — R9431 Abnormal electrocardiogram [ECG] [EKG]: Secondary | ICD-10-CM

## 2020-06-14 LAB — ECHOCARDIOGRAM COMPLETE
Area-P 1/2: 3.68 cm2
Height: 64 in
S' Lateral: 3 cm
Weight: 3040 oz

## 2020-06-14 LAB — BASIC METABOLIC PANEL
Anion gap: 11 (ref 5–15)
BUN: 15 mg/dL (ref 6–20)
CO2: 26 mmol/L (ref 22–32)
Calcium: 8.5 mg/dL — ABNORMAL LOW (ref 8.9–10.3)
Chloride: 102 mmol/L (ref 98–111)
Creatinine, Ser: 1.2 mg/dL — ABNORMAL HIGH (ref 0.44–1.00)
GFR calc Af Amer: >60 mL/min (ref >60)
GFR calc non Af Amer: 59 mL/min — ABNORMAL LOW (ref >60)
Glucose, Bld: 114 mg/dL — ABNORMAL HIGH (ref 70–99)
Potassium: 3.6 mmol/L (ref 3.5–5.1)
Sodium: 139 mmol/L (ref 135–145)

## 2020-06-14 LAB — CBC
HCT: 26.6 % — ABNORMAL LOW (ref 36.0–46.0)
Hemoglobin: 8.4 g/dL — ABNORMAL LOW (ref 12.0–15.0)
MCH: 24 pg — ABNORMAL LOW (ref 26.0–34.0)
MCHC: 31.6 g/dL (ref 30.0–36.0)
MCV: 76 fL — ABNORMAL LOW (ref 80.0–100.0)
Platelets: 122 10*3/uL — ABNORMAL LOW (ref 150–400)
RBC: 3.5 MIL/uL — ABNORMAL LOW (ref 3.87–5.11)
RDW: 19.6 % — ABNORMAL HIGH (ref 11.5–15.5)
WBC: 10 10*3/uL (ref 4.0–10.5)
nRBC: 1.4 % — ABNORMAL HIGH (ref 0.0–0.2)

## 2020-06-14 LAB — THERAPEUTIC PLASMA EXCHANGE (BLOOD BANK)
Plasma Exchange: 3100
Plasma volume needed: 3100
Unit division: 0
Unit division: 0
Unit division: 0
Unit division: 0
Unit division: 0
Unit division: 0
Unit division: 0
Unit division: 0
Unit division: 0
Unit division: 0

## 2020-06-14 LAB — URINE CULTURE: Culture: <10000 — AB

## 2020-06-14 LAB — LACTATE DEHYDROGENASE: LDH: 270 U/L — ABNORMAL HIGH (ref 98–192)

## 2020-06-14 MED ORDER — ALTEPLASE 2 MG IJ SOLR
INTRAMUSCULAR | Status: AC
  Filled 2020-06-14: qty 2

## 2020-06-14 MED ORDER — ACD FORMULA A 0.73-2.45-2.2 GM/100ML VI SOLN
1000.0000 mL | Status: DC
  Administered 2020-06-14: 1000 mL
  Filled 2020-06-14: qty 1000

## 2020-06-14 MED ORDER — DIPHENHYDRAMINE HCL 25 MG PO CAPS
25.0000 mg | ORAL_CAPSULE | Freq: Four times a day (QID) | ORAL | Status: DC | PRN
  Administered 2020-06-14: 25 mg via ORAL
  Filled 2020-06-14: qty 1

## 2020-06-14 MED ORDER — CALCIUM CARBONATE ANTACID 500 MG PO CHEW
CHEWABLE_TABLET | ORAL | Status: AC
  Administered 2020-06-14: 400 mg
  Filled 2020-06-14: qty 2

## 2020-06-14 MED ORDER — SODIUM CHLORIDE 0.9% FLUSH
10.0000 mL | INTRAVENOUS | Status: DC | PRN
  Administered 2020-06-15: 10 mL

## 2020-06-14 MED ORDER — CALCIUM GLUCONATE 10 % IV SOLN
3.0000 g | Freq: Once | INTRAVENOUS | Status: DC
  Filled 2020-06-14: qty 30

## 2020-06-14 MED ORDER — ANTICOAGULANT SODIUM CITRATE 4% (200MG/5ML) IV SOLN
5.0000 mL | Freq: Once | Status: AC
  Administered 2020-06-14: 5 mL
  Filled 2020-06-14: qty 5

## 2020-06-14 MED ORDER — ACD FORMULA A 0.73-2.45-2.2 GM/100ML VI SOLN
Status: AC
  Filled 2020-06-14: qty 500

## 2020-06-14 MED ORDER — ACETAMINOPHEN 325 MG PO TABS
650.0000 mg | ORAL_TABLET | ORAL | Status: DC | PRN
  Administered 2020-06-14: 650 mg via ORAL

## 2020-06-14 MED ORDER — CALCIUM GLUCONATE-NACL 2-0.675 GM/100ML-% IV SOLN
2.0000 g | Freq: Once | INTRAVENOUS | Status: AC
  Administered 2020-06-14: 2000 mg via INTRAVENOUS
  Filled 2020-06-14 (×3): qty 100

## 2020-06-14 NOTE — Progress Notes (Signed)
PIV consult: Pt has trialysis catheter for plasmapheresis. OK to use pigtail for infusions. Treat as a central line. IV Team will maintain line with flushes and disconnect infusions.

## 2020-06-14 NOTE — Progress Notes (Signed)
[facility]    Date of Admission:  06/11/2020   Total days of antibiotics 3           ID: Catherine Martinez is a 34 y.o. female with   Principal Problem:   TTP (thrombotic thrombocytopenic purpura) (HCC) Active Problems:   HIV INFECTION   Trichomonas infection   Transaminitis   Acute lower UTI   Acute kidney injury superimposed on CKD (HCC)   Microcytic hypochromic anemia   Hyperbilirubinemia    Subjective: Continues to tolerate PLEX and feeling better this morning. Slight discomfort to right neck HD temp catheter  ROS: 12 point ros is negative Medications:  . Chlorhexidine Gluconate Cloth  6 each Topical Daily  . dapsone  100 mg Oral Daily  . dolutegravir  50 mg Oral Daily  . emtricitabine-tenofovir AF  1 tablet Oral Daily  . predniSONE  80 mg Oral Q breakfast  . sodium chloride flush  3 mL Intravenous Once  . sodium chloride flush  3 mL Intravenous Q12H    Objective: Vital signs in last 24 hours: Temp:  [97.3 F (36.3 C)-98.8 F (37.1 C)] 98.6 F (37 C) (08/25 0757) Pulse Rate:  [79-114] 91 (08/25 0757) Resp:  [14-24] 17 (08/25 0757) BP: (93-141)/(36-92) 115/80 (08/25 0757) SpO2:  [98 %-100 %] 99 % (08/25 0757) Physical Exam  Constitutional:  oriented to person, place, and time. appears well-developed and well-nourished. No distress.  HENT: [place]/AT, PERRLA, no scleral icterus Mouth/Throat: Oropharynx is clear and moist. No oropharyngeal exudate.  Cardiovascular: Normal rate, regular rhythm and normal heart sounds. Exam reveals no gallop and no friction rub.  No murmur heard.  Pulmonary/Chest: Effort normal and breath sounds normal. No respiratory distress.  has no wheezes.  Neck = supple, no nuchal rigidity. Right temp HD catheter site is c/d/i Abdominal: Soft. Bowel sounds are normal.  exhibits no distension. There is no tenderness.  Neurological: alert and oriented to person, place, and time.  Skin: Skin is warm and dry. No rash noted.  No erythema.  Psychiatric: a normal mood and affect.  behavior is normal.    Lab Results Recent Labs    06/11/20 1421 06/11/20 1622 06/12/20 0803 06/13/20 0646  WBC  --  5.4  --  6.8  HGB  --  8.2*  --  8.5*  HCT  --  25.7*  --  26.2*  NA   < >  --  135 140  K   < >  --  3.7 3.5  CL   < >  --  104 102  CO2   < >  --  19* 27  BUN   < >  --  20 18  CREATININE   < >  --  1.13* 1.11*   < > = values in this interval not displayed.   Liver Panel Recent Labs    06/12/20 0232 06/13/20 0646  PROT 8.7* 6.9  ALBUMIN 3.4* 3.4*  AST 76* 40  ALT 44 27  ALKPHOS 54 46  BILITOT 7.3* 3.7*  BILIDIR 1.2*  --   IBILI 6.1*  --     Microbiology:  Studies/Results: No results found.   Assessment/Plan:  uti = last day of ceftriaxone to treat uti  Trichomonas = continue on metronidazole 500mg  bid x 7 days  TTP= estimated to complete 5 sessions of plasma exchange. Seeing some improvements platelets up to 26K  hiv disease= poorly controlled, VL at 16,200. CD 4 count at  209 (10%) - started on oi proph yesterday with dapsone. She appears to tolerate smaller pills of tivicay/descovy. Plan on continuing as we await for genotype resistance to come back in 2 wk.      [facility] Cell: [phone] Pager: [phone]  06/14/2020, 11:48 AM

## 2020-06-14 NOTE — Progress Notes (Signed)
  Echocardiogram 2D Echocardiogram has been performed.  Leta Jungling M 06/14/2020, 3:15 PM

## 2020-06-14 NOTE — Progress Notes (Signed)
Progress Note    Catherine Martinez  ZTI:[id number] DOB: 06/05/1986  DOA: 06/11/2020 PCP: Patient, No Pcp Per    Brief Narrative:     Medical records reviewed and are as summarized below:  Catherine Martinez is an 34 y.o. female with medical history significant ofHIV, UTIs, and anemiapresents with complaints of dizziness. Last week she had noticed a strong odor to her urine and was having back pain. She was evaluated 5 days ago and found to have signs of a urinary tract infection/pyelonephritis started on ciprofloxacin.  Her peripheral smear had signs of schistocytes. Oncology was consulted ordered further work-up for TTP. IR has been consulted to place a temporary pheresis catheter to start plasma exchange  Assessment/Plan:   Principal Problem:   TTP (thrombotic thrombocytopenic purpura) (HCC) Active Problems:   HIV INFECTION   Trichomonas infection   Transaminitis   Acute lower UTI   Acute kidney injury superimposed on CKD (HCC)   Microcytic hypochromic anemia   Hyperbilirubinemia   TTP: Thrombocytopenia with elevated LDH, indirect bilirubin and mild renal insufficiency.  Underwent right IJ catheter placement by IR with no complications of bleeding.  Coombs testing, haptoglobin, [id number] level sent.  Seen by hematology and patient receiving oral prednisone and plasmapheresis (s/p 2 sessions thus far: thought to need 5-6 total per hematology).  -unfortunately no labs sent this AM to measure plts-- have ordered  HIV disease: -has not been seen by infectious disease in over a year after losing her insurance. However, she has been taking her antiretroviral medications as prescribed.  Appreciate infectious diseases evaluation and management-medication regimen changed and viral load/CD4 counts sent.  Microcytic anemia: Iron level 154 and percent saturations elevated at 51, normal TIBC with ferritin at 910.  Possibly related to problem #1.  Received 1 unit of PRBC per oncology  recommendations yesterday.  Patient apparently was on iron supplements previously.  Monitor for now.  Hyperbilirubinemia: Indirect bilirubin greater than direct indicative of hemolysis.  Elevated LDH also indicates the same.  Appears to be improving slowly.  Abnormal UA with trichomonas: Being treated with IV ceftriaxone for UTI given complaints of dysuria and UA showing pyuria/bacteriuria (has already been treated with cipro outpatient) -urine culture w/o growth -flagyl for trichomonas  Troponinemia:  -Unclear significance.  - EKG within t wave inversions-- suspected to be new (personally reviewed) -I have ordered echo to be done for WMA  obesity Body mass index is 32.61 kg/m.   Family Communication/Anticipated D/C date and plan/Code Status   DVT prophylaxis: none ordered-- will monitor with SCDs and ambulation Code Status: Full Code.  Family Communication: patient will update Disposition Plan: Status is: Inpatient  Remains inpatient appropriate because:Inpatient level of care appropriate due to severity of illness   Dispo: The patient is from: Home              Anticipated d/c is to: Home              Anticipated d/c date is: > 3 days              Patient currently is not medically stable to d/c.- needs continued plasmaphoresis         Medical Consultants:    IR  PCCM  ID  Hematology  Subjective:   Patient states she was given a breathing treatment the other day and was wondering why  Objective:    Vitals:   06/13/20 1810 06/13/20 1902 06/13/20 2100 06/14/20 0757  BP: 119/86 120/85 122/84  115/80  Pulse: (!) 111 96 100 91  Resp: (!) 21 17 18 17   Temp: 98.5 F (36.9 C) 98.2 F (36.8 C) 98.7 F (37.1 C) 98.6 F (37 C)  TempSrc: Oral Oral Oral Oral  SpO2: 100% 100% 98% 99%  Weight:      Height:        Intake/Output Summary (Last 24 hours) at 06/14/2020 0908 Last data filed at 06/14/2020 0600 Gross per 24 hour  Intake 660 ml  Output --  Net  660 ml   Filed Weights   06/11/20 1422 06/12/20 0754  Weight: 86.2 kg 86.2 kg    Exam:  General: Appearance:    Obese female in no acute distress     Lungs:     Clear to auscultation bilaterally, respirations unlabored  Heart:    Normal heart rate. Normal rhythm. No murmurs, rubs, or gallops.   MS:   All extremities are intact.   Neurologic:   Awake, alert, oriented x 3. No apparent focal neurological           defect.     Data Reviewed:   I have personally reviewed following labs and imaging studies:  Labs: Labs show the following:   Basic Metabolic Panel: Recent Labs  Lab 06/11/20 1421 06/11/20 1421 06/12/20 0803 06/13/20 0646  NA 135  --  135 140  K 3.9   < > 3.7 3.5  CL 105  --  104 102  CO2 21*  --  19* 27  GLUCOSE 115*  --  103* 109*  BUN 20  --  20 18  CREATININE 1.19*  --  1.13* 1.11*  CALCIUM 8.6*  --  8.7* 9.4   < > = values in this interval not displayed.   GFR Estimated Creatinine Clearance: 75.2 mL/min (A) (by C-G formula based on SCr of 1.11 mg/dL (H)). Liver Function Tests: Recent Labs  Lab 06/12/20 0232 06/13/20 0646  AST 76* 40  ALT 44 27  ALKPHOS 54 46  BILITOT 7.3* 3.7*  PROT 8.7* 6.9  ALBUMIN 3.4* 3.4*   No results for input(s): LIPASE, AMYLASE in the last 168 hours. No results for input(s): AMMONIA in the last 168 hours. Coagulation profile Recent Labs  Lab 06/12/20 0232  INR 1.3*    CBC: Recent Labs  Lab 06/11/20 1421 06/11/20 1622 06/12/20 0232 06/13/20 0646  WBC 5.1 5.4  --  6.8  NEUTROABS  --  2.9  --   --   HGB 8.2* 8.2*  --  8.5*  HCT 25.7* 25.7*  --  26.2*  MCV 74.1* 74.3*  --  73.2*  PLT 6* 6* 6* 26*   Cardiac Enzymes: No results for input(s): CKTOTAL, CKMB, CKMBINDEX, TROPONINI in the last 168 hours. BNP (last 3 results) No results for input(s): PROBNP in the last 8760 hours. CBG: No results for input(s): GLUCAP in the last 168 hours. D-Dimer: Recent Labs    06/12/20 0232  DDIMER 13.81*   Hgb  A1c: No results for input(s): HGBA1C in the last 72 hours. Lipid Profile: No results for input(s): CHOL, HDL, LDLCALC, TRIG, CHOLHDL, LDLDIRECT in the last 72 hours. Thyroid function studies: No results for input(s): TSH, T4TOTAL, T3FREE, THYROIDAB in the last 72 hours.  Invalid input(s): FREET3 Anemia work up: Recent Labs    06/12/20 0803  VITAMINB12 479  FOLATE 13.8  FERRITIN 910*  TIBC 304  IRON 154  RETICCTPCT 2.8   Sepsis Labs: Recent Labs  Lab 06/11/20 1421 06/11/20  1622 06/13/20 0646  WBC 5.1 5.4 6.8    Microbiology Recent Results (from the past 240 hour(s))  Culture, Urine     Status: Abnormal   Collection Time: 06/12/20  5:21 AM   Specimen: Urine, Random  Result Value Ref Range Status   Specimen Description URINE, RANDOM  Final   Special Requests NONE  Final   Culture (A)  Final    <10,000 COLONIES/mL INSIGNIFICANT GROWTH Performed at [facility], [address] [place], [place] [address]    Report Status 06/14/2020 FINAL  Final  SARS Coronavirus 2 by RT PCR (hospital order, performed in [facility] hospital lab) Nasopharyngeal Nasopharyngeal Swab     Status: None   Collection Time: 06/12/20  8:03 AM   Specimen: Nasopharyngeal Swab  Result Value Ref Range Status   SARS Coronavirus 2 NEGATIVE NEGATIVE Final    Comment: (NOTE) SARS-CoV-2 target nucleic acids are NOT DETECTED.  The SARS-CoV-2 RNA is generally detectable in upper and lower respiratory specimens during the acute phase of infection. The lowest concentration of SARS-CoV-2 viral copies this assay can detect is 250 copies / mL. A negative result does not preclude SARS-CoV-2 infection and should not be used as the sole basis for treatment or other patient management decisions.  A negative result may occur with improper specimen collection / handling, submission of specimen other than nasopharyngeal swab, presence of viral mutation(s) within the areas targeted by this assay, and  inadequate number of viral copies (<250 copies / mL). A negative result must be combined with clinical observations, patient history, and epidemiological information.  Fact Sheet for Patients:   BoilerBrush.com.cy  Fact Sheet for Healthcare Providers: https://pope.com/  This test is not yet approved or  cleared by the [place] FDA and has been authorized for detection and/or diagnosis of SARS-CoV-2 by FDA under an Emergency Use Authorization (EUA).  This EUA will remain in effect (meaning this test can be used) for the duration of the COVID-19 declaration under Section 564(b)(1) of the Act, 21 U.S.C. section 360bbb-3(b)(1), unless the authorization is terminated or revoked sooner.  Performed at [facility], [address], [place], [place] [address]     Procedures and diagnostic studies:  IR Fluoro Guide CV Line Right  Result Date: 06/12/2020 CLINICAL DATA:  TTP, needs venous access for pheresis EXAM: EXAM RIGHT IJ CATHETER PLACEMENT UNDER ULTRASOUND AND FLUOROSCOPIC GUIDANCE TECHNIQUE: The procedure, risks (including but not limited to bleeding, infection, organ damage, pneumothorax), benefits, and alternatives were explained to the patient. Questions regarding the procedure were encouraged and answered. The patient understands and consents to the procedure. Patency of the right IJ vein was confirmed with ultrasound with image documentation. An appropriate skin site was determined. Skin site was marked. Region was prepped using maximum barrier technique including cap and mask, sterile gown, sterile gloves, large sterile sheet, and Chlorhexidine as cutaneous antisepsis. The region was infiltrated locally with 1% lidocaine. Under real-time ultrasound guidance, the right IJ vein was accessed with a 19 gauge needle; the needle tip within the vein was confirmed with ultrasound image documentation. The needle exchanged over a guidewire  for vascular dilator which allowed advancement of a 16 cm Trialysis catheter. This was positioned with the tip at the cavoatrial junction. Spot chest radiograph shows good positioning and no pneumothorax. Catheter was flushed and sutured externally with 0-Prolene sutures. Patient tolerated the procedure well. FLUOROSCOPY TIME:  6 seconds, less than 0.1 mGy COMPLICATIONS: COMPLICATIONS none IMPRESSION: 1. Technically successful  right IJ Trialysis catheter placement. Electronically Signed   By: Corlis Leak M.D.   On: 06/12/2020 14:12   IR US Guide Vasc Access Right  Result Date: 06/12/2020 CLINICAL DATA:  TTP, needs venous access for pheresis EXAM: EXAM RIGHT IJ CATHETER PLACEMENT UNDER ULTRASOUND AND FLUOROSCOPIC GUIDANCE TECHNIQUE: The procedure, risks (including but not limited to bleeding, infection, organ damage, pneumothorax), benefits, and alternatives were explained to the patient. Questions regarding the procedure were encouraged and answered. The patient understands and consents to the procedure. Patency of the right IJ vein was confirmed with ultrasound with image documentation. An appropriate skin site was determined. Skin site was marked. Region was prepped using maximum barrier technique including cap and mask, sterile gown, sterile gloves, large sterile sheet, and Chlorhexidine as cutaneous antisepsis. The region was infiltrated locally with 1% lidocaine. Under real-time ultrasound guidance, the right IJ vein was accessed with a 19 gauge needle; the needle tip within the vein was confirmed with ultrasound image documentation. The needle exchanged over a guidewire for vascular dilator which allowed advancement of a 16 cm Trialysis catheter. This was positioned with the tip at the cavoatrial junction. Spot chest radiograph shows good positioning and no pneumothorax. Catheter was flushed and sutured externally with 0-Prolene sutures. Patient tolerated the procedure well. FLUOROSCOPY TIME:  6 seconds,  less than 0.1 mGy COMPLICATIONS: COMPLICATIONS none IMPRESSION: 1. Technically successful right IJ Trialysis catheter placement. Electronically Signed   By: Corlis Leak M.D.   On: 06/12/2020 14:12    Medications:   . Chlorhexidine Gluconate Cloth  6 each Topical Daily  . dapsone  100 mg Oral Daily  . dolutegravir  50 mg Oral Daily  . emtricitabine-tenofovir AF  1 tablet Oral Daily  . predniSONE  80 mg Oral Q breakfast  . sodium chloride flush  3 mL Intravenous Once  . sodium chloride flush  3 mL Intravenous Q12H   Continuous Infusions: . cefTRIAXone (ROCEPHIN)  IV Stopped (06/13/20 1007)  . citrate dextrose    . metronidazole Stopped (06/13/20 2204)     LOS: 2 days   Joseph Art  [facility]   How to contact the [facility] Attending or Consulting provider 7A - 7P or covering provider during after hours 7P -7A, for this patient?  1. Check the care team in [facility] and look for a) attending/consulting [facility] provider listed and b) the [facility] team listed 2. Log into [email or web address] and use [place]'s universal password to access. If you do not have the password, please contact the hospital operator. 3. Locate the [facility] provider you are looking for under [facility] and page to a number that you can be directly reached. 4. If you still have difficulty reaching the provider, please page the [facility] (Director on Call) for the Hospitalists listed on amion for assistance.  06/14/2020, 9:08 AM

## 2020-06-14 NOTE — Progress Notes (Signed)
Events noted.  There was major complaints or complications related to her therapeutic plasma exchange.  Laboratory data reviewed and showed excellent response by LDH criteria as well as platelet recovery.  Her platelet count is up to 122 with LDH declining to 270.  Her hemoglobin currently stable at 8.4 with a hematocrit of 26.  Iron studies do not show any evidence of severe iron deficiency anemia at this time.  For the time being, I recommend continuing the current management approach with therapeutic plasma exchange daily till she has a complete recovery of her platelet count.  At that point we will discontinue her exchange and taper prednisone to off.  I anticipate therapeutic plasma exchange for at least Thursday and Friday.  We will continue to follow.

## 2020-06-14 NOTE — Plan of Care (Signed)

## 2020-06-15 LAB — THERAPEUTIC PLASMA EXCHANGE (BLOOD BANK)
Plasma Exchange: 3100
Plasma volume needed: 3100
Unit division: 0
Unit division: 0
Unit division: 0
Unit division: 0

## 2020-06-15 LAB — POCT I-STAT, CHEM 8
BUN: 15 mg/dL (ref 6–20)
Calcium, Ion: 0.65 mmol/L — CL (ref 1.15–1.40)
Chloride: 97 mmol/L — ABNORMAL LOW (ref 98–111)
Creatinine, Ser: 1 mg/dL (ref 0.44–1.00)
Glucose, Bld: 144 mg/dL — ABNORMAL HIGH (ref 70–99)
HCT: 37 % (ref 36.0–46.0)
Hemoglobin: 12.6 g/dL (ref 12.0–15.0)
Potassium: 3.6 mmol/L (ref 3.5–5.1)
Sodium: 143 mmol/L (ref 135–145)
TCO2: 23 mmol/L (ref 22–32)

## 2020-06-15 LAB — CBC
HCT: 26.1 % — ABNORMAL LOW (ref 36.0–46.0)
Hemoglobin: 8.1 g/dL — ABNORMAL LOW (ref 12.0–15.0)
MCH: 23.8 pg — ABNORMAL LOW (ref 26.0–34.0)
MCHC: 31 g/dL (ref 30.0–36.0)
MCV: 76.8 fL — ABNORMAL LOW (ref 80.0–100.0)
Platelets: 167 10*3/uL (ref 150–400)
RBC: 3.4 MIL/uL — ABNORMAL LOW (ref 3.87–5.11)
RDW: 19.7 % — ABNORMAL HIGH (ref 11.5–15.5)
WBC: 12.4 10*3/uL — ABNORMAL HIGH (ref 4.0–10.5)
nRBC: 1.9 % — ABNORMAL HIGH (ref 0.0–0.2)

## 2020-06-15 LAB — BASIC METABOLIC PANEL
Anion gap: 9 (ref 5–15)
BUN: 16 mg/dL (ref 6–20)
CO2: 27 mmol/L (ref 22–32)
Calcium: 8.7 mg/dL — ABNORMAL LOW (ref 8.9–10.3)
Chloride: 103 mmol/L (ref 98–111)
Creatinine, Ser: 1.08 mg/dL — ABNORMAL HIGH (ref 0.44–1.00)
GFR calc Af Amer: >60 mL/min (ref >60)
GFR calc non Af Amer: >60 mL/min (ref >60)
Glucose, Bld: 107 mg/dL — ABNORMAL HIGH (ref 70–99)
Potassium: 4.3 mmol/L (ref 3.5–5.1)
Sodium: 139 mmol/L (ref 135–145)

## 2020-06-15 LAB — LACTATE DEHYDROGENASE: LDH: 208 U/L — ABNORMAL HIGH (ref 98–192)

## 2020-06-15 MED ORDER — DIPHENHYDRAMINE HCL 25 MG PO CAPS
ORAL_CAPSULE | ORAL | Status: AC
  Administered 2020-06-15: 25 mg via ORAL
  Filled 2020-06-15: qty 1

## 2020-06-15 MED ORDER — ACD FORMULA A 0.73-2.45-2.2 GM/100ML VI SOLN
Status: AC
  Administered 2020-06-15: 1000 mL
  Filled 2020-06-15: qty 500

## 2020-06-15 MED ORDER — METRONIDAZOLE 500 MG PO TABS
500.0000 mg | ORAL_TABLET | Freq: Two times a day (BID) | ORAL | Status: DC
  Administered 2020-06-15 – 2020-06-17 (×3): 500 mg via ORAL
  Filled 2020-06-15 (×4): qty 1

## 2020-06-15 MED ORDER — CALCIUM GLUCONATE-NACL 2-0.675 GM/100ML-% IV SOLN
2.0000 g | Freq: Once | INTRAVENOUS | Status: AC
  Administered 2020-06-15: 2000 mg via INTRAVENOUS
  Filled 2020-06-15 (×2): qty 100

## 2020-06-15 MED ORDER — CALCIUM CARBONATE ANTACID 500 MG PO CHEW
2.0000 | CHEWABLE_TABLET | ORAL | Status: AC
  Administered 2020-06-15: 400 mg via ORAL

## 2020-06-15 MED ORDER — DESCOVY 200-25 MG PO TABS: 1.0000 | ORAL_TABLET | Freq: Every day | ORAL | 0 refills | Status: DC

## 2020-06-15 MED ORDER — ANTICOAGULANT SODIUM CITRATE 4% (200MG/5ML) IV SOLN
5.0000 mL | Freq: Once | Status: AC
  Administered 2020-06-15: 5 mL
  Filled 2020-06-15: qty 5

## 2020-06-15 MED ORDER — ACETAMINOPHEN 325 MG PO TABS: 650.0000 mg | ORAL_TABLET | ORAL | Status: DC | PRN

## 2020-06-15 MED ORDER — CALCIUM CARBONATE ANTACID 500 MG PO CHEW
CHEWABLE_TABLET | ORAL | Status: AC
  Administered 2020-06-15: 400 mg via ORAL
  Filled 2020-06-15: qty 4

## 2020-06-15 MED ORDER — ACD FORMULA A 0.73-2.45-2.2 GM/100ML VI SOLN
1000.0000 mL | Status: DC
  Filled 2020-06-15: qty 1000

## 2020-06-15 MED ORDER — DIPHENHYDRAMINE HCL 25 MG PO CAPS: 25.0000 mg | ORAL_CAPSULE | Freq: Four times a day (QID) | ORAL | Status: DC | PRN

## 2020-06-15 MED FILL — DESCOVY 200-25 MG TABS: 200-25 | 30 days supply | Qty: 30 | Fill #0

## 2020-06-15 NOTE — Progress Notes (Signed)
[facility]    Date of Admission:  06/11/2020   Total days of antibiotics 4           ID: Catherine Martinez is a 34 y.o. female with HIV disease presents with dizziness from poor po intake but also found to have TTP Principal Problem:   TTP (thrombotic thrombocytopenic purpura) (HCC) Active Problems:   HIV INFECTION   Trichomonas infection   Transaminitis   Acute lower UTI   Acute kidney injury superimposed on CKD (HCC)   Microcytic hypochromic anemia   Hyperbilirubinemia    Subjective: Afebrile, tolerating plex. plt up to 167. Tolerating tivicay and descovy without difficulty  Medications:  . calcium carbonate  2 tablet Oral Q3H  . Chlorhexidine Gluconate Cloth  6 each Topical Daily  . dapsone  100 mg Oral Daily  . dolutegravir  50 mg Oral Daily  . emtricitabine-tenofovir AF  1 tablet Oral Daily  . metroNIDAZOLE  500 mg Oral Q12H  . predniSONE  80 mg Oral Q breakfast  . sodium chloride flush  3 mL Intravenous Once  . sodium chloride flush  3 mL Intravenous Q12H    Objective: Vital signs in last 24 hours: Temp:  [97.5 F (36.4 C)-98.8 F (37.1 C)] 98.4 F (36.9 C) (08/26 1349) Pulse Rate:  [75-115] 105 (08/26 1512) Resp:  [14-19] 15 (08/26 1512) BP: (106-129)/(49-88) 118/65 (08/26 1512) SpO2:  [97 %-100 %] 99 % (08/26 1349) Physical Exam  Constitutional:  oriented to person, place, and time. appears well-developed and well-nourished. No distress.  HENT: [place]/AT, PERRLA, no scleral icterus Mouth/Throat: Oropharynx is clear and moist. No oropharyngeal exudate.  Cardiovascular: Normal rate, regular rhythm and normal heart sounds. Exam reveals no gallop and no friction rub.  No murmur heard.  Pulmonary/Chest: Effort normal and breath sounds normal. No respiratory distress.  has no wheezes.  Neck = supple, no nuchal rigidity. Right IJ HD catheter c/d/i Abdominal: Soft. Bowel sounds are normal.  exhibits no distension. There is no tenderness.    Lymphadenopathy: no cervical adenopathy. No axillary adenopathy Neurological: alert and oriented to person, place, and time.  Skin: Skin is warm and dry. No rash noted. No erythema.  Psychiatric: a normal mood and affect.  behavior is normal.    Lab Results Recent Labs    06/13/20 0646 06/14/20 1210 06/14/20 1307 06/14/20 1307 06/15/20 0342 06/15/20 1512  WBC  --  10.0  --   --  12.4*  --   HGB  --  8.4*  --    < > 8.1* 12.6  HCT  --  26.6*  --    < > 26.1* 37.0  NA   < >  --  139   < > 139 143  K   < >  --  3.6   < > 4.3 3.6  CL   < >  --  102   < > 103 97*  CO2   < >  --  26  --  27  --   BUN   < >  --  15   < > 16 15  CREATININE   < >  --  1.20*   < > 1.08* 1.00   < > = values in this interval not displayed.   Liver Panel Recent Labs    06/13/20 0646  PROT 6.9  ALBUMIN 3.4*  AST 40  ALT 27  ALKPHOS 46  BILITOT 3.7*    Microbiology: ua -  trichomonas Studies/Results: ECHOCARDIOGRAM COMPLETE  Result Date: 06/14/2020    ECHOCARDIOGRAM REPORT   Patient Name:   Catherine Martinez Date of Exam: 06/14/2020 Medical Rec #:  [id number]   Height:       64.0 in Accession #:    [id number]  Weight:       190.0 lb Date of Birth:  1986-09-04   BSA:          1.914 m Patient Age:    33 years    BP:           115/80 mmHg Patient Gender: F           HR:           91 bpm. Exam Location:  Inpatient Procedure: 2D Echo Indications:    Elevated Troponin  History:        Patient has no prior history of Echocardiogram examinations.                 HIV, Acute kidney injury, Microcytic hypochromic anemia.  Sonographer:    Leta Jungling RDCS Sonographer#2:  Eulah Pont RDCS Referring Phys: [id number] JESSICA U VANN IMPRESSIONS  1. Left ventricular ejection fraction, by estimation, is 55 to 60%. The left ventricle has normal function. The left ventricle has no regional wall motion abnormalities. Left ventricular diastolic parameters were normal.  2. Right ventricular systolic function is normal. The right  ventricular size is normal. Tricuspid regurgitation signal is inadequate for assessing PA pressure.  3. The mitral valve is grossly normal. No evidence of mitral valve regurgitation. No evidence of mitral stenosis.  4. The aortic valve is tricuspid. Aortic valve regurgitation is not visualized. No aortic stenosis is present.  5. The inferior vena cava is normal in size with greater than 50% respiratory variability, suggesting right atrial pressure of 3 mmHg. Conclusion(s)/Recommendation(s): Normal biventricular function without evidence of hemodynamically significant valvular heart disease. FINDINGS  Left Ventricle: Left ventricular ejection fraction, by estimation, is 55 to 60%. The left ventricle has normal function. The left ventricle has no regional wall motion abnormalities. The left ventricular internal cavity size was normal in size. There is  no left ventricular hypertrophy. Left ventricular diastolic parameters were normal. Right Ventricle: The right ventricular size is normal. No increase in right ventricular wall thickness. Right ventricular systolic function is normal. Tricuspid regurgitation signal is inadequate for assessing PA pressure. Left Atrium: Left atrial size was normal in size. Right Atrium: Right atrial size was normal in size. Pericardium: Trivial pericardial effusion is present. Mitral Valve: The mitral valve is grossly normal. No evidence of mitral valve regurgitation. No evidence of mitral valve stenosis. Tricuspid Valve: The tricuspid valve is grossly normal. Tricuspid valve regurgitation is trivial. No evidence of tricuspid stenosis. Aortic Valve: The aortic valve is tricuspid. Aortic valve regurgitation is not visualized. No aortic stenosis is present. Pulmonic Valve: The pulmonic valve was grossly normal. Pulmonic valve regurgitation is not visualized. No evidence of pulmonic stenosis. Aorta: The aortic root and ascending aorta are structurally normal, with no evidence of dilitation.  Venous: The inferior vena cava is normal in size with greater than 50% respiratory variability, suggesting right atrial pressure of 3 mmHg. IAS/Shunts: The atrial septum is grossly normal.  LEFT VENTRICLE PLAX 2D LVIDd:         4.80 cm  Diastology LVIDs:         3.00 cm  LV e' lateral:   12.70 cm/s LV PW:  0.80 cm  LV E/e' lateral: 4.3 LV IVS:        0.90 cm  LV e' medial:    6.64 cm/s LVOT diam:     2.10 cm  LV E/e' medial:  8.3 LV SV:         53 LV SV Index:   28 LVOT Area:     3.46 cm  RIGHT VENTRICLE RV S prime:     14.10 cm/s TAPSE (M-mode): 2.1 cm LEFT ATRIUM             Index       RIGHT ATRIUM           Index LA diam:        2.80 cm 1.46 cm/m  RA Area:     11.50 cm LA Vol (A2C):   36.4 ml 19.02 ml/m RA Volume:   26.80 ml  14.00 ml/m LA Vol (A4C):   32.1 ml 16.77 ml/m LA Biplane Vol: 36.3 ml 18.96 ml/m  AORTIC VALVE LVOT Vmax:   106.00 cm/s LVOT Vmean:  65.900 cm/s LVOT VTI:    0.154 m  AORTA Ao Root diam: 3.20 cm MITRAL VALVE MV Area (PHT): 3.68 cm    SHUNTS MV Decel Time: 206 msec    Systemic VTI:  0.15 m MV E velocity: 54.80 cm/s  Systemic Diam: 2.10 cm MV A velocity: 63.40 cm/s MV E/A ratio:  0.86 Lennie Odor MD Electronically signed by Lennie Odor MD Signature Date/Time: 06/14/2020/4:44:18 PM    Final      Assessment/Plan: TTP = appears to steadily be recovering with plasma exchange and steroids. plex til Saturday. Anticipate line removal that day as well. Dr Clelia Croft recommending tapering steroids down 20mg  per week.  HIV disease = will plan on continuing descovy and tivicay daily. Pharmacy getting access to meds so that she will have meds on hand. Will arrange follow up in 2-3 wks  Health maintenance = recommend covid vaccine once she finishes steroid taper  Trichomonas= 3 more days of treatment, will switch to oral metronidazole  uti = finished treatment.  [facility] Cell: [phone] Pager: [phone]  06/15/2020, 3:49  PM

## 2020-06-15 NOTE — Progress Notes (Signed)
IP PROGRESS NOTE  Subjective:   [name] reports feeling well without any complaints at this time.  She continues to tolerate plasma exchange without any issues.  She denies any nausea vomiting neuropathy.  She denies any pain along her catheter or bleeding issues.  She denies any fevers or flank pain.  Overall she continues to recover appropriately.  Objective:  Vital signs in last 24 hours: Temp:  [97.5 F (36.4 C)-98.8 F (37.1 C)] 98.8 F (37.1 C) (08/26 0735) Pulse Rate:  [75-115] 81 (08/26 0735) Resp:  [14-19] 17 (08/26 0735) BP: (106-129)/(49-88) 120/88 (08/26 0735) SpO2:  [97 %-100 %] 100 % (08/26 0735) Weight change:  Last BM Date: 06/12/20  Intake/Output from previous day: 08/25 0701 - 08/26 0700 In: 440 [P.O.:240; IV Piggyback:200] Out: -  General: Alert, awake without distress. Head: Normocephalic atraumatic. Mouth: mucous membranes moist, pharynx normal without lesions Eyes: No scleral icterus.  Pupils are equal and round reactive to light. Resp: clear to auscultation bilaterally without rhonchi or wheezes or dullness to percussion. Cardio: regular rate and rhythm, S1, S2 normal, no murmur, click, rub or gallop GI: soft, non-tender; bowel sounds normal; no masses,  no organomegaly Musculoskeletal: No joint deformity or effusion. Neurological: No motor, sensory deficits.  Intact deep tendon reflexes. Skin: No rashes or lesions.  Dialysis catheter site without erythema  Lab Results: Recent Labs    06/14/20 1210 06/15/20 0342  WBC 10.0 12.4*  HGB 8.4* 8.1*  HCT 26.6* 26.1*  PLT 122* 167    BMET Recent Labs    06/14/20 1307 06/15/20 0342  NA 139 139  K 3.6 4.3  CL 102 103  CO2 26 27  GLUCOSE 114* 107*  BUN 15 16  CREATININE 1.20* 1.08*  CALCIUM 8.5* 8.7*    Studies/Results: ECHOCARDIOGRAM COMPLETE  Result Date: 06/14/2020    ECHOCARDIOGRAM REPORT   Patient Name:   Catherine Martinez Date of Exam: 06/14/2020 Medical Rec #:  [id number]   Height:        64.0 in Accession #:    [id number]  Weight:       190.0 lb Date of Birth:  November 22, 1985   BSA:          1.914 m Patient Age:    33 years    BP:           115/80 mmHg Patient Gender: F           HR:           91 bpm. Exam Location:  Inpatient Procedure: 2D Echo Indications:    Elevated Troponin  History:        Patient has no prior history of Echocardiogram examinations.                 HIV, Acute kidney injury, Microcytic hypochromic anemia.  Sonographer:    Leta Jungling RDCS Sonographer#2:  Eulah Pont RDCS Referring Phys: [id number] Catherine Martinez IMPRESSIONS  1. Left ventricular ejection fraction, by estimation, is 55 to 60%. The left ventricle has normal function. The left ventricle has no regional wall motion abnormalities. Left ventricular diastolic parameters were normal.  2. Right ventricular systolic function is normal. The right ventricular size is normal. Tricuspid regurgitation signal is inadequate for assessing PA pressure.  3. The mitral valve is grossly normal. No evidence of mitral valve regurgitation. No evidence of mitral stenosis.  4. The aortic valve is tricuspid. Aortic valve regurgitation is not visualized. No aortic stenosis is present.  5.  The inferior vena cava is normal in size with greater than 50% respiratory variability, suggesting right atrial pressure of 3 mmHg. Conclusion(s)/Recommendation(s): Normal biventricular function without evidence of hemodynamically significant valvular heart disease. FINDINGS  Left Ventricle: Left ventricular ejection fraction, by estimation, is 55 to 60%. The left ventricle has normal function. The left ventricle has no regional wall motion abnormalities. The left ventricular internal cavity size was normal in size. There is  no left ventricular hypertrophy. Left ventricular diastolic parameters were normal. Right Ventricle: The right ventricular size is normal. No increase in right ventricular wall thickness. Right ventricular systolic function is normal.  Tricuspid regurgitation signal is inadequate for assessing PA pressure. Left Atrium: Left atrial size was normal in size. Right Atrium: Right atrial size was normal in size. Pericardium: Trivial pericardial effusion is present. Mitral Valve: The mitral valve is grossly normal. No evidence of mitral valve regurgitation. No evidence of mitral valve stenosis. Tricuspid Valve: The tricuspid valve is grossly normal. Tricuspid valve regurgitation is trivial. No evidence of tricuspid stenosis. Aortic Valve: The aortic valve is tricuspid. Aortic valve regurgitation is not visualized. No aortic stenosis is present. Pulmonic Valve: The pulmonic valve was grossly normal. Pulmonic valve regurgitation is not visualized. No evidence of pulmonic stenosis. Aorta: The aortic root and ascending aorta are structurally normal, with no evidence of dilitation. Venous: The inferior vena cava is normal in size with greater than 50% respiratory variability, suggesting right atrial pressure of 3 mmHg. IAS/Shunts: The atrial septum is grossly normal.  LEFT VENTRICLE PLAX 2D LVIDd:         4.80 cm  Diastology LVIDs:         3.00 cm  LV e' lateral:   12.70 cm/s LV PW:         0.80 cm  LV E/e' lateral: 4.3 LV IVS:        0.90 cm  LV e' medial:    6.64 cm/s LVOT diam:     2.10 cm  LV E/e' medial:  8.3 LV SV:         53 LV SV Index:   28 LVOT Area:     3.46 cm  RIGHT VENTRICLE RV S prime:     14.10 cm/s TAPSE (M-mode): 2.1 cm LEFT ATRIUM             Index       RIGHT ATRIUM           Index LA diam:        2.80 cm 1.46 cm/m  RA Area:     11.50 cm LA Vol (A2C):   36.4 ml 19.02 ml/m RA Volume:   26.80 ml  14.00 ml/m LA Vol (A4C):   32.1 ml 16.77 ml/m LA Biplane Vol: 36.3 ml 18.96 ml/m  AORTIC VALVE LVOT Vmax:   106.00 cm/s LVOT Vmean:  65.900 cm/s LVOT VTI:    0.154 m  AORTA Ao Root diam: 3.20 cm MITRAL VALVE MV Area (PHT): 3.68 cm    SHUNTS MV Decel Time: 206 msec    Systemic VTI:  0.15 m MV E velocity: 54.80 cm/s  Systemic Diam: 2.10 cm MV  A velocity: 63.40 cm/s MV E/A ratio:  0.86 Lennie Odor MD Electronically signed by Lennie Odor MD Signature Date/Time: 06/14/2020/4:44:18 PM    Final     Medications: I have reviewed the patient's current medications.  Assessment/Plan:  34 year old woman with:  1.  TTP presented with severe thrombocytopenia and elevated LDH and schistocytes on  the peripheral smear.  He is currently receiving plasma exchange with prednisone.  She has achieved a complete response rather rapidly with a normalization of her platelet count and near normalization of her LDH.  I recommended continuing plasma exchange for a total of 3 treatments after normal platelet count.  This means she will receive a plasma exchange on 8/26, 8/27 and the last treatment will be on 8/28.  If her platelet counts continues to be normal in the next 3 days, her dialysis catheter can be removed and she can be discharged after 8/28 plasma exchange session.  Prednisone taper will start after discontinuation of plasma exchange by 20 mg a week.  This was discussed with the patient today and will be reiterated in the next few days.   2.  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  Iron studies do not show evidence of iron deficiency at this time.  Her hemoglobin electrophoresis did not show any clear-cut of hemoglobinopathy completed in 2014.  I recommend continued monitoring and transfuse if her hematocrit is less than 24 in order to receive plasma exchange.   3.  HIV: Dr. Drue Second is following.  He does have a increased viral load and depressed CD4 and was started on appropriate antiretroviral treatment.  4.  Follow-up and disposition: I anticipate discharge after Saturdays plasma exchange if she continues to have normal platelet count.  We will arrange follow-up at the cancer center for repeat testing next week.   35  minutes were  dedicated to this visit.  More than 50% of the time was spent face-to-face and the time was spent on reviewing laboratory data, discussing treatment options, discussing differential diagnosis and answering questions regarding future plan.    LOS: 3 days   Eli Hose 06/15/2020, 7:46 AM

## 2020-06-15 NOTE — Plan of Care (Signed)

## 2020-06-15 NOTE — Progress Notes (Addendum)
Progress Note    Catherine Decampia Puleo  XBM:[id number] DOB: Oct 29, 1985  DOA: 06/11/2020 PCP: Patient, No Pcp Per    Brief Narrative:     Medical records reviewed and are as summarized below:  Catherine Martinez is an 34 y.o. female with medical history significant ofHIV, UTIs, and anemiapresents with complaints of dizziness. Last week she had noticed a strong odor to her urine and was having back pain. She was evaluated 5 days ago and found to have signs of a urinary tract infection/pyelonephritis started on ciprofloxacin.  Her peripheral smear had signs of schistocytes. Oncology was consulted ordered further work-up for TTP. IR has been consulted to place a temporary pheresis catheter to start plasma exchange  Assessment/Plan:   Principal Problem:   TTP (thrombotic thrombocytopenic purpura) (HCC) Active Problems:   HIV INFECTION   Trichomonas infection   Transaminitis   Acute lower UTI   Acute kidney injury superimposed on CKD (HCC)   Microcytic hypochromic anemia   Hyperbilirubinemia   TTP: Thrombocytopenia with elevated LDH, indirect bilirubin and mild renal insufficiency.  Underwent right IJ catheter placement by IR with no complications of bleeding.  Coombs testing, haptoglobin, andADAMTS13 level sent.  Seen by hematology and patient receiving oral prednisone and plasmapheresis (will get 8/26, 8/27 and 8/28) -prednisone will need to be tapered by 20 q weekly  HIV disease: -has not been seen by infectious disease in over a year after losing her insurance. However, she has been taking her antiretroviral medications as prescribed.  Appreciate infectious diseases evaluation and management-medication regimen changed and viral load/CD4 counts sent.  Microcytic anemia: Iron level 154 and percent saturations elevated at 51, normal TIBC with ferritin at 910.  Possibly related to problem #1.  Received 1 unit of PRBC per oncology recommendations yesterday.  Patient apparently was on iron  supplements previously.  Monitor for now.  Hyperbilirubinemia: Indirect bilirubin greater than direct indicative of hemolysis.  Elevated LDH also indicates the same.  Appears to be improving slowly.  Abnormal UA with trichomonas: Being treated with IV ceftriaxone for UTI x3 days given complaints of dysuria and UA showing pyuria/bacteriuria (has already been treated with cipro outpatient) -urine culture w/o growth -flagyl for trichomonas  Troponinemia:  -Unclear significance.  - EKG within t wave inversions-- suspected to be new (personally reviewed) -echo normal  obesity Body mass index is 32.61 kg/m.   Family Communication/Anticipated D/C date and plan/Code Status   DVT prophylaxis: none ordered-- will monitor with SCDs and ambulation Code Status: Full Code.  Family Communication: patient will update Disposition Plan: Status is: Inpatient  Remains inpatient appropriate because:Inpatient level of care appropriate due to severity of illness   Dispo: The patient is from: Home              Anticipated d/c is to: Home              Anticipated d/c date is:8/28              Patient currently is not medically stable to d/c.- needs continued plasmaphoresis         Medical Consultants:    IR  PCCM  ID  Hematology  Subjective:   No current complaints- was able to explain the plan from hematology to me  Objective:    Vitals:   06/14/20 1848 06/14/20 1913 06/15/20 0502 06/15/20 0735  BP: 109/76 124/87 108/71 120/88  Pulse: 97 90 75 81  Resp: 17 14 17 17   Temp: 98.1 F (36.7  C) 98 F (36.7 C) 97.9 F (36.6 C) 98.8 F (37.1 C)  TempSrc: Axillary Oral Oral Oral  SpO2: 99% 97% 100% 100%  Weight:      Height:        Intake/Output Summary (Last 24 hours) at 06/15/2020 1044 Last data filed at 06/15/2020 0700 Gross per 24 hour  Intake 560 ml  Output --  Net 560 ml   Filed Weights   06/11/20 1422 06/12/20 0754  Weight: 86.2 kg 86.2 kg     Exam:   General: Appearance:    Obese female in no acute distress     Lungs:     Clear to auscultation bilaterally, respirations unlabored  Heart:    Normal heart rate. Normal rhythm. No murmurs, rubs, or gallops.   MS:   All extremities are intact.   Neurologic:   Awake, alert, oriented x 3. No apparent focal neurological           defect.    Data Reviewed:   I have personally reviewed following labs and imaging studies:  Labs: Labs show the following:   Basic Metabolic Panel: Recent Labs  Lab 06/11/20 1421 06/11/20 1421 06/12/20 0803 06/12/20 0803 06/13/20 0646 06/13/20 0646 06/14/20 1307 06/15/20 0342  NA 135  --  135  --  140  --  139 139  K 3.9   < > 3.7   < > 3.5   < > 3.6 4.3  CL 105  --  104  --  102  --  102 103  CO2 21*  --  19*  --  27  --  26 27  GLUCOSE 115*  --  103*  --  109*  --  114* 107*  BUN 20  --  20  --  18  --  15 16  CREATININE 1.19*  --  1.13*  --  1.11*  --  1.20* 1.08*  CALCIUM 8.6*  --  8.7*  --  9.4  --  8.5* 8.7*   < > = values in this interval not displayed.   GFR Estimated Creatinine Clearance: 77.2 mL/min (A) (by C-G formula based on SCr of 1.08 mg/dL (H)). Liver Function Tests: Recent Labs  Lab 06/12/20 0232 06/13/20 0646  AST 76* 40  ALT 44 27  ALKPHOS 54 46  BILITOT 7.3* 3.7*  PROT 8.7* 6.9  ALBUMIN 3.4* 3.4*   No results for input(s): LIPASE, AMYLASE in the last 168 hours. No results for input(s): AMMONIA in the last 168 hours. Coagulation profile Recent Labs  Lab 06/12/20 0232  INR 1.3*    CBC: Recent Labs  Lab 06/11/20 1421 06/11/20 1421 06/11/20 1622 06/12/20 0232 06/13/20 0646 06/14/20 1210 06/15/20 0342  WBC 5.1  --  5.4  --  6.8 10.0 12.4*  NEUTROABS  --   --  2.9  --   --   --   --   HGB 8.2*  --  8.2*  --  8.5* 8.4* 8.1*  HCT 25.7*  --  25.7*  --  26.2* 26.6* 26.1*  MCV 74.1*  --  74.3*  --  73.2* 76.0* 76.8*  PLT 6*   < > 6* 6* 26* 122* 167   < > = values in this interval not displayed.    Cardiac Enzymes: No results for input(s): CKTOTAL, CKMB, CKMBINDEX, TROPONINI in the last 168 hours. BNP (last 3 results) No results for input(s): PROBNP in the last 8760 hours. CBG: No results for input(s): GLUCAP  in the last 168 hours. D-Dimer: No results for input(s): DDIMER in the last 72 hours. Hgb A1c: No results for input(s): HGBA1C in the last 72 hours. Lipid Profile: No results for input(s): CHOL, HDL, LDLCALC, TRIG, CHOLHDL, LDLDIRECT in the last 72 hours. Thyroid function studies: No results for input(s): TSH, T4TOTAL, T3FREE, THYROIDAB in the last 72 hours.  Invalid input(s): FREET3 Anemia work up: No results for input(s): VITAMINB12, FOLATE, FERRITIN, TIBC, IRON, RETICCTPCT in the last 72 hours. Sepsis Labs: Recent Labs  Lab 06/11/20 1622 06/13/20 0646 06/14/20 1210 06/15/20 0342  WBC 5.4 6.8 10.0 12.4*    Microbiology Recent Results (from the past 240 hour(s))  Culture, Urine     Status: Abnormal   Collection Time: 06/12/20  5:21 AM   Specimen: Urine, Random  Result Value Ref Range Status   Specimen Description URINE, RANDOM  Final   Special Requests NONE  Final   Culture (A)  Final    <10,000 COLONIES/mL INSIGNIFICANT GROWTH Performed at [facility], [address], [place], [place] [address]    Report Status 06/14/2020 FINAL  Final  SARS Coronavirus 2 by RT PCR (hospital order, performed in [facility] hospital lab) Nasopharyngeal Nasopharyngeal Swab     Status: None   Collection Time: 06/12/20  8:03 AM   Specimen: Nasopharyngeal Swab  Result Value Ref Range Status   SARS Coronavirus 2 NEGATIVE NEGATIVE Final    Comment: (NOTE) SARS-CoV-2 target nucleic acids are NOT DETECTED.  The SARS-CoV-2 RNA is generally detectable in upper and lower respiratory specimens during the acute phase of infection. The lowest concentration of SARS-CoV-2 viral copies this assay can detect is 250 copies / mL. A negative result does not preclude SARS-CoV-2  infection and should not be used as the sole basis for treatment or other patient management decisions.  A negative result may occur with improper specimen collection / handling, submission of specimen other than nasopharyngeal swab, presence of viral mutation(s) within the areas targeted by this assay, and inadequate number of viral copies (<250 copies / mL). A negative result must be combined with clinical observations, patient history, and epidemiological information.  Fact Sheet for Patients:   BoilerBrush.com.cy  Fact Sheet for Healthcare Providers: https://pope.com/  This test is not yet approved or  cleared by the [place] FDA and has been authorized for detection and/or diagnosis of SARS-CoV-2 by FDA under an Emergency Use Authorization (EUA).  This EUA will remain in effect (meaning this test can be used) for the duration of the COVID-19 declaration under Section 564(b)(1) of the Act, 21 U.S.C. section 360bbb-3(b)(1), unless the authorization is terminated or revoked sooner.  Performed at [facility], [address], [place], [place] [address]     Procedures and diagnostic studies:  ECHOCARDIOGRAM COMPLETE  Result Date: 06/14/2020    ECHOCARDIOGRAM REPORT   Patient Name:   Sintia Luera Date of Exam: 06/14/2020 Medical Rec #:  [id number]   Height:       64.0 in Accession #:    [id number]  Weight:       190.0 lb Date of Birth:  Dec 19, 1985   BSA:          1.914 m Patient Age:    33 years    BP:           115/80 mmHg Patient Gender: F           HR:           91  bpm. Exam Location:  Inpatient Procedure: 2D Echo Indications:    Elevated Troponin  History:        Patient has no prior history of Echocardiogram examinations.                 HIV, Acute kidney injury, Microcytic hypochromic anemia.  Sonographer:    Leta Jungling RDCS Sonographer#2:  Eulah Pont RDCS Referring Phys: [id number] [address] Grieder U Brylen Wagar IMPRESSIONS  1. Left  ventricular ejection fraction, by estimation, is 55 to 60%. The left ventricle has normal function. The left ventricle has no regional wall motion abnormalities. Left ventricular diastolic parameters were normal.  2. Right ventricular systolic function is normal. The right ventricular size is normal. Tricuspid regurgitation signal is inadequate for assessing PA pressure.  3. The mitral valve is grossly normal. No evidence of mitral valve regurgitation. No evidence of mitral stenosis.  4. The aortic valve is tricuspid. Aortic valve regurgitation is not visualized. No aortic stenosis is present.  5. The inferior vena cava is normal in size with greater than 50% respiratory variability, suggesting right atrial pressure of 3 mmHg. Conclusion(s)/Recommendation(s): Normal biventricular function without evidence of hemodynamically significant valvular heart disease. FINDINGS  Left Ventricle: Left ventricular ejection fraction, by estimation, is 55 to 60%. The left ventricle has normal function. The left ventricle has no regional wall motion abnormalities. The left ventricular internal cavity size was normal in size. There is  no left ventricular hypertrophy. Left ventricular diastolic parameters were normal. Right Ventricle: The right ventricular size is normal. No increase in right ventricular wall thickness. Right ventricular systolic function is normal. Tricuspid regurgitation signal is inadequate for assessing PA pressure. Left Atrium: Left atrial size was normal in size. Right Atrium: Right atrial size was normal in size. Pericardium: Trivial pericardial effusion is present. Mitral Valve: The mitral valve is grossly normal. No evidence of mitral valve regurgitation. No evidence of mitral valve stenosis. Tricuspid Valve: The tricuspid valve is grossly normal. Tricuspid valve regurgitation is trivial. No evidence of tricuspid stenosis. Aortic Valve: The aortic valve is tricuspid. Aortic valve regurgitation is not  visualized. No aortic stenosis is present. Pulmonic Valve: The pulmonic valve was grossly normal. Pulmonic valve regurgitation is not visualized. No evidence of pulmonic stenosis. Aorta: The aortic root and ascending aorta are structurally normal, with no evidence of dilitation. Venous: The inferior vena cava is normal in size with greater than 50% respiratory variability, suggesting right atrial pressure of 3 mmHg. IAS/Shunts: The atrial septum is grossly normal.  LEFT VENTRICLE PLAX 2D LVIDd:         4.80 cm  Diastology LVIDs:         3.00 cm  LV e' lateral:   12.70 cm/s LV PW:         0.80 cm  LV E/e' lateral: 4.3 LV IVS:        0.90 cm  LV e' medial:    6.64 cm/s LVOT diam:     2.10 cm  LV E/e' medial:  8.3 LV SV:         53 LV SV Index:   28 LVOT Area:     3.46 cm  RIGHT VENTRICLE RV S prime:     14.10 cm/s TAPSE (M-mode): 2.1 cm LEFT ATRIUM             Index       RIGHT ATRIUM           Index LA diam:  2.80 cm 1.46 cm/m  RA Area:     11.50 cm LA Vol (A2C):   36.4 ml 19.02 ml/m RA Volume:   26.80 ml  14.00 ml/m LA Vol (A4C):   32.1 ml 16.77 ml/m LA Biplane Vol: 36.3 ml 18.96 ml/m  AORTIC VALVE LVOT Vmax:   106.00 cm/s LVOT Vmean:  65.900 cm/s LVOT VTI:    0.154 m  AORTA Ao Root diam: 3.20 cm MITRAL VALVE MV Area (PHT): 3.68 cm    SHUNTS MV Decel Time: 206 msec    Systemic VTI:  0.15 m MV E velocity: 54.80 cm/s  Systemic Diam: 2.10 cm MV A velocity: 63.40 cm/s MV E/A ratio:  0.86 Lennie Odor MD Electronically signed by Lennie Odor MD Signature Date/Time: 06/14/2020/4:44:18 PM    Final     Medications:   . Chlorhexidine Gluconate Cloth  6 each Topical Daily  . dapsone  100 mg Oral Daily  . dolutegravir  50 mg Oral Daily  . emtricitabine-tenofovir AF  1 tablet Oral Daily  . predniSONE  80 mg Oral Q breakfast  . sodium chloride flush  3 mL Intravenous Once  . sodium chloride flush  3 mL Intravenous Q12H   Continuous Infusions: . citrate dextrose    . metronidazole 500 mg (06/15/20  0910)     LOS: 3 days   Joseph Art  [facility]   How to contact the [facility] Attending or Consulting provider 7A - 7P or covering provider during after hours 7P -7A, for this patient?  1. Check the care team in [facility] and look for a) attending/consulting TR[facility] provider listed and b) the [facility] team listed 2. Log into [email or web address] and use [facility]'s universal password to access. If you do not have the password, please contact the hospital operator. 3. Locate the [facility] provider you are looking for under [facility] and page to a number that you can be directly reached. 4. If you still have difficulty reaching the provider, please page the [facility] (Director on Call) for the Hospitalists listed on amion for assistance.  06/15/2020, 10:44 AM

## 2020-06-16 ENCOUNTER — Other Ambulatory Visit: Payer: Self-pay | Admitting: Oncology

## 2020-06-16 ENCOUNTER — Telehealth: Payer: Self-pay | Admitting: Oncology

## 2020-06-16 DIAGNOSIS — M3119 Other thrombotic microangiopathy: Secondary | ICD-10-CM

## 2020-06-16 LAB — BASIC METABOLIC PANEL
Anion gap: 8 (ref 5–15)
BUN: 15 mg/dL (ref 6–20)
CO2: 29 mmol/L (ref 22–32)
Calcium: 8.5 mg/dL — ABNORMAL LOW (ref 8.9–10.3)
Chloride: 101 mmol/L (ref 98–111)
Creatinine, Ser: 0.98 mg/dL (ref 0.44–1.00)
GFR calc Af Amer: >60 mL/min (ref >60)
GFR calc non Af Amer: >60 mL/min (ref >60)
Glucose, Bld: 99 mg/dL (ref 70–99)
Potassium: 3.5 mmol/L (ref 3.5–5.1)
Sodium: 138 mmol/L (ref 135–145)

## 2020-06-16 LAB — THERAPEUTIC PLASMA EXCHANGE (BLOOD BANK)
Plasma Exchange: 3100
Plasma volume needed: 3100
Unit division: 0
Unit division: 0
Unit division: 0
Unit division: 0
Unit division: 0
Unit division: 0
Unit division: 0
Unit division: 0
Unit division: 0

## 2020-06-16 LAB — TYPE AND SCREEN
ABO/RH(D): O POS
Antibody Screen: POSITIVE
Unit division: 0
Unit division: 0

## 2020-06-16 LAB — HAPTOGLOBIN: Haptoglobin: <10 mg/dL — ABNORMAL LOW (ref 33–278)

## 2020-06-16 LAB — POCT I-STAT, CHEM 8
BUN: 12 mg/dL (ref 6–20)
Calcium, Ion: 0.78 mmol/L — CL (ref 1.15–1.40)
Chloride: 96 mmol/L — ABNORMAL LOW (ref 98–111)
Creatinine, Ser: 0.9 mg/dL (ref 0.44–1.00)
Glucose, Bld: 155 mg/dL — ABNORMAL HIGH (ref 70–99)
HCT: 26 % — ABNORMAL LOW (ref 36.0–46.0)
Hemoglobin: 8.8 g/dL — ABNORMAL LOW (ref 12.0–15.0)
Potassium: 3.4 mmol/L — ABNORMAL LOW (ref 3.5–5.1)
Sodium: 141 mmol/L (ref 135–145)
TCO2: 23 mmol/L (ref 22–32)

## 2020-06-16 LAB — CBC
HCT: 23.9 % — ABNORMAL LOW (ref 36.0–46.0)
Hemoglobin: 7.3 g/dL — ABNORMAL LOW (ref 12.0–15.0)
MCH: 24.6 pg — ABNORMAL LOW (ref 26.0–34.0)
MCHC: 30.5 g/dL (ref 30.0–36.0)
MCV: 80.5 fL (ref 80.0–100.0)
Platelets: 227 10*3/uL (ref 150–400)
RBC: 2.97 MIL/uL — ABNORMAL LOW (ref 3.87–5.11)
RDW: 19.2 % — ABNORMAL HIGH (ref 11.5–15.5)
WBC: 11.5 10*3/uL — ABNORMAL HIGH (ref 4.0–10.5)
nRBC: 1.8 % — ABNORMAL HIGH (ref 0.0–0.2)

## 2020-06-16 LAB — BPAM RBC
Blood Product Expiration Date: 202109192359
Blood Product Expiration Date: 202109192359
ISSUE DATE / TIME: 202108230931
Unit Type and Rh: 5100
Unit Type and Rh: 5100

## 2020-06-16 LAB — LACTATE DEHYDROGENASE: LDH: 166 U/L (ref 98–192)

## 2020-06-16 LAB — PREPARE RBC (CROSSMATCH)

## 2020-06-16 MED ORDER — ACETAMINOPHEN 325 MG PO TABS: 650.0000 mg | ORAL_TABLET | ORAL | Status: DC | PRN

## 2020-06-16 MED ORDER — METRONIDAZOLE 500 MG PO TABS: 500.0000 mg | ORAL_TABLET | Freq: Two times a day (BID) | ORAL | 0 refills | Status: AC

## 2020-06-16 MED ORDER — DIPHENHYDRAMINE HCL 25 MG PO CAPS
ORAL_CAPSULE | ORAL | Status: AC
  Administered 2020-06-16: 25 mg via ORAL
  Filled 2020-06-16: qty 1

## 2020-06-16 MED ORDER — ACD FORMULA A 0.73-2.45-2.2 GM/100ML VI SOLN
1000.0000 mL | Status: DC
  Filled 2020-06-16: qty 1000

## 2020-06-16 MED ORDER — DAPSONE 100 MG PO TABS
100.0000 mg | ORAL_TABLET | Freq: Every day | ORAL | 0 refills | Status: DC
Start: 2020-06-17 — End: 2020-07-03

## 2020-06-16 MED ORDER — DIPHENHYDRAMINE HCL 25 MG PO CAPS: 25.0000 mg | ORAL_CAPSULE | Freq: Four times a day (QID) | ORAL | Status: DC | PRN

## 2020-06-16 MED ORDER — CALCIUM CARBONATE ANTACID 500 MG PO CHEW
2.0000 | CHEWABLE_TABLET | ORAL | Status: AC
  Administered 2020-06-16: 400 mg via ORAL

## 2020-06-16 MED ORDER — SODIUM CHLORIDE 0.9% IV SOLUTION: Freq: Once | INTRAVENOUS | Status: AC

## 2020-06-16 MED ORDER — ACD FORMULA A 0.73-2.45-2.2 GM/100ML VI SOLN
Status: AC
  Administered 2020-06-16: 1000 mL
  Filled 2020-06-16: qty 500

## 2020-06-16 MED ORDER — CALCIUM GLUCONATE-NACL 2-0.675 GM/100ML-% IV SOLN
2.0000 g | Freq: Once | INTRAVENOUS | Status: AC
  Administered 2020-06-16: 2000 mg via INTRAVENOUS
  Filled 2020-06-16: qty 100

## 2020-06-16 MED ORDER — ANTICOAGULANT SODIUM CITRATE 4% (200MG/5ML) IV SOLN
5.0000 mL | Freq: Once | Status: AC
  Administered 2020-06-16: 5 mL
  Filled 2020-06-16: qty 5

## 2020-06-16 MED ORDER — TIVICAY 50 MG PO TABS: 50.0000 mg | ORAL_TABLET | Freq: Every day | ORAL | 0 refills | Status: DC

## 2020-06-16 MED ORDER — CALCIUM CARBONATE ANTACID 500 MG PO CHEW
CHEWABLE_TABLET | ORAL | Status: AC
  Administered 2020-06-16: 400 mg via ORAL
  Filled 2020-06-16: qty 4

## 2020-06-16 MED ORDER — PREDNISONE 20 MG PO TABS: ORAL_TABLET | ORAL | 0 refills | Status: DC

## 2020-06-16 MED FILL — DAPSONE 100 MG TABS: 100 | 30 days supply | Qty: 30 | Fill #0

## 2020-06-16 MED FILL — predniSONE 20 MG TABS: 20 | 28 days supply | Qty: 70 | Fill #0

## 2020-06-16 MED FILL — metroNIDAZOLE 500 MG TABS: 500 | 2 days supply | Qty: 4 | Fill #0

## 2020-06-16 MED FILL — TIVICAY 50 MG TABLET: 50 | 30 days supply | Qty: 30 | Fill #0

## 2020-06-16 NOTE — Plan of Care (Signed)
  Problem: Safety: Goal: Ability to remain free from injury will improve Outcome: Progressing   

## 2020-06-16 NOTE — Progress Notes (Signed)
ID Pharmacist Progress Note   [name] has been approved for patient assistance for both Tivicay and Descovy.    Billing information is as follows and has been shared with the [facility]:  Tivicay: ID: [id number] Bin: [id number] PCN: [id number] Group: [id number]  Descovy: ID: [id number] [id number] PCN: [id number] Group: [id number]   The [facility] will fill her prescriptions and store them in Main Pharmacy in preparation for her possible weekend discharge on 8/28. Please ensure that [name] has her prescriptions in hand when she is discharged.   Thank you for allowing pharmacy to be a part of this patient's care.   Sharin Mons, PharmD, BCPS, BCIDP Infectious Diseases Clinical Pharmacist Phone: [phone] 06/16/2020 10:12 AM

## 2020-06-16 NOTE — Progress Notes (Signed)
Progress Note    Emilygrace Grothe  EXH:[id number] DOB: 22-Jun-1986  DOA: 06/11/2020 PCP: Patient, No Pcp Per    Brief Narrative:     Medical records reviewed and are as summarized below:  Carlie Solorzano is an 34 y.o. female with medical history significant ofHIV, UTIs, and anemiapresents with complaints of dizziness. Last week she had noticed a strong odor to her urine and was having back pain. She was evaluated 5 days ago and found to have signs of a urinary tract infection/pyelonephritis started on ciprofloxacin.  Her peripheral smear had signs of schistocytes. Oncology was consulted ordered further work-up for TTP. IR has been consulted to place a temporary pheresis catheter to start plasma exchange  Assessment/Plan:   Principal Problem:   TTP (thrombotic thrombocytopenic purpura) (HCC) Active Problems:   HIV INFECTION   Trichomonas infection   Transaminitis   Acute lower UTI   Acute kidney injury superimposed on CKD (HCC)   Microcytic hypochromic anemia   Hyperbilirubinemia   TTP: Thrombocytopenia with elevated LDH, indirect bilirubin and mild renal insufficiency.  Underwent right IJ catheter placement by IR with no complications of bleeding.  Coombs testing, haptoglobin, [id number] level sent.  Seen by hematology and patient receiving oral prednisone and plasmapheresis (will get 8/26, 8/27 and 8/28) -prednisone will need to be tapered by 20 q weekly after plasmapheresis stopped  HIV disease: -has not been seen by infectious disease in over a year after losing her insurance. However, she has been taking her antiretroviral medications as prescribed.  Appreciate infectious diseases evaluation and management-medication regimen changed and viral load/CD4 counts sent.  Microcytic anemia: Iron level 154 and percent saturations elevated at 51, normal TIBC with ferritin at 910.  Possibly related to problem #1.  -s/p 2 units PRBCs - Patient apparently was on iron supplements  previously.  Monitor for now.  Hyperbilirubinemia: Indirect bilirubin greater than direct indicative of hemolysis.  Elevated LDH also indicates the same.  Appears to be improving slowly.  Abnormal UA with trichomonas: Being treated with IV ceftriaxone for UTI x3 days given complaints of dysuria and UA showing pyuria/bacteriuria (has already been treated with cipro outpatient) -urine culture w/o growth -flagyl for trichomonas x 7 days  Troponinemia:  -Unclear significance.  - EKG within t wave inversions-- suspected to be new (personally reviewed) -echo normal  obesity Body mass index is 32.61 kg/m.   Family Communication/Anticipated D/C date and plan/Code Status   DVT prophylaxis: none ordered-- will monitor with SCDs and ambulation Code Status: Full Code.  Family Communication: patient will update Disposition Plan: Status is: Inpatient  Remains inpatient appropriate because:Inpatient level of care appropriate due to severity of illness   Dispo: The patient is from: Home              Anticipated d/c is to: Home              Anticipated d/c date is:8/28              Patient currently is not medically stable to d/c.         Medical Consultants:    IR  PCCM  ID  Hematology  Subjective:   No overnight events  Objective:    Vitals:   06/15/20 1805 06/15/20 1917 06/16/20 0335 06/16/20 0729  BP: 122/83 124/84 111/81 122/75  Pulse: 97 100 74 82  Resp: 18 18 17 17   Temp: 98.5 F (36.9 C) 98.2 F (36.8 C) 97.6 F (36.4 C) 98.7 F (37.1  C)  TempSrc: Oral Oral Oral Oral  SpO2: 99% 99% 100% 100%  Weight:      Height:        Intake/Output Summary (Last 24 hours) at 06/16/2020 0940 Last data filed at 06/16/2020 0400 Gross per 24 hour  Intake 600 ml  Output --  Net 600 ml   Filed Weights   06/11/20 1422 06/12/20 0754  Weight: 86.2 kg 86.2 kg    Exam:  General: Appearance:    Obese female in no acute distress     Lungs:      respirations  unlabored  Heart:    Normal heart rate. Normal rhythm. No murmurs, rubs, or gallops.   MS:   All extremities are intact.   Neurologic:   Awake, alert, oriented x 3. No apparent focal neurological           defect.     Data Reviewed:   I have personally reviewed following labs and imaging studies:  Labs: Labs show the following:   Basic Metabolic Panel: Recent Labs  Lab 06/12/20 0803 06/12/20 0803 06/13/20 0646 06/13/20 0646 06/14/20 1307 06/14/20 1307 06/15/20 0342 06/15/20 0342 06/15/20 1512 06/16/20 0340  NA 135   < > 140  --  139  --  139  --  143 138  K 3.7   < > 3.5   < > 3.6   < > 4.3   < > 3.6 3.5  CL 104   < > 102  --  102  --  103  --  97* 101  CO2 19*  --  27  --  26  --  27  --   --  29  GLUCOSE 103*   < > 109*  --  114*  --  107*  --  144* 99  BUN 20   < > 18  --  15  --  16  --  15 15  CREATININE 1.13*   < > 1.11*  --  1.20*  --  1.08*  --  1.00 0.98  CALCIUM 8.7*  --  9.4  --  8.5*  --  8.7*  --   --  8.5*   < > = values in this interval not displayed.   GFR Estimated Creatinine Clearance: 85.1 mL/min (by C-G formula based on SCr of 0.98 mg/dL). Liver Function Tests: Recent Labs  Lab 06/12/20 0232 06/13/20 0646  AST 76* 40  ALT 44 27  ALKPHOS 54 46  BILITOT 7.3* 3.7*  PROT 8.7* 6.9  ALBUMIN 3.4* 3.4*   No results for input(s): LIPASE, AMYLASE in the last 168 hours. No results for input(s): AMMONIA in the last 168 hours. Coagulation profile Recent Labs  Lab 06/12/20 0232  INR 1.3*    CBC: Recent Labs  Lab 06/11/20 1622 06/11/20 1622 06/12/20 0232 06/13/20 0646 06/14/20 1210 06/15/20 0342 06/15/20 1512 06/16/20 0340  WBC 5.4  --   --  6.8 10.0 12.4*  --  11.5*  NEUTROABS 2.9  --   --   --   --   --   --   --   HGB 8.2*   < >  --  8.5* 8.4* 8.1* 12.6 7.3*  HCT 25.7*   < >  --  26.2* 26.6* 26.1* 37.0 23.9*  MCV 74.3*  --   --  73.2* 76.0* 76.8*  --  80.5  PLT 6*   < > 6* 26* 122* 167  --  227   < > =  values in this interval not  displayed.   Cardiac Enzymes: No results for input(s): CKTOTAL, CKMB, CKMBINDEX, TROPONINI in the last 168 hours. BNP (last 3 results) No results for input(s): PROBNP in the last 8760 hours. CBG: No results for input(s): GLUCAP in the last 168 hours. D-Dimer: No results for input(s): DDIMER in the last 72 hours. Hgb A1c: No results for input(s): HGBA1C in the last 72 hours. Lipid Profile: No results for input(s): CHOL, HDL, LDLCALC, TRIG, CHOLHDL, LDLDIRECT in the last 72 hours. Thyroid function studies: No results for input(s): TSH, T4TOTAL, T3FREE, THYROIDAB in the last 72 hours.  Invalid input(s): FREET3 Anemia work up: No results for input(s): VITAMINB12, FOLATE, FERRITIN, TIBC, IRON, RETICCTPCT in the last 72 hours. Sepsis Labs: Recent Labs  Lab 06/13/20 0646 06/14/20 1210 06/15/20 0342 06/16/20 0340  WBC 6.8 10.0 12.4* 11.5*    Microbiology Recent Results (from the past 240 hour(s))  Culture, Urine     Status: Abnormal   Collection Time: 06/12/20  5:21 AM   Specimen: Urine, Random  Result Value Ref Range Status   Specimen Description URINE, RANDOM  Final   Special Requests NONE  Final   Culture (A)  Final    <10,000 COLONIES/mL INSIGNIFICANT GROWTH Performed at [facility], [address] [place], [place] [address]    Report Status 06/14/2020 FINAL  Final  SARS Coronavirus 2 by RT PCR (hospital order, performed in [facility] hospital lab) Nasopharyngeal Nasopharyngeal Swab     Status: None   Collection Time: 06/12/20  8:03 AM   Specimen: Nasopharyngeal Swab  Result Value Ref Range Status   SARS Coronavirus 2 NEGATIVE NEGATIVE Final    Comment: (NOTE) SARS-CoV-2 target nucleic acids are NOT DETECTED.  The SARS-CoV-2 RNA is generally detectable in upper and lower respiratory specimens during the acute phase of infection. The lowest concentration of SARS-CoV-2 viral copies this assay can detect is 250 copies / mL. A negative result does not preclude  SARS-CoV-2 infection and should not be used as the sole basis for treatment or other patient management decisions.  A negative result may occur with improper specimen collection / handling, submission of specimen other than nasopharyngeal swab, presence of viral mutation(s) within the areas targeted by this assay, and inadequate number of viral copies (<250 copies / mL). A negative result must be combined with clinical observations, patient history, and epidemiological information.  Fact Sheet for Patients:   BoilerBrush.com.cy  Fact Sheet for Healthcare Providers: https://pope.com/  This test is not yet approved or  cleared by the [place] FDA and has been authorized for detection and/or diagnosis of SARS-CoV-2 by FDA under an Emergency Use Authorization (EUA).  This EUA will remain in effect (meaning this test can be used) for the duration of the COVID-19 declaration under Section 564(b)(1) of the Act, 21 U.S.C. section 360bbb-3(b)(1), unless the authorization is terminated or revoked sooner.  Performed at [facility], [address] [place], [place] [address]     Procedures and diagnostic studies:  ECHOCARDIOGRAM COMPLETE  Result Date: 06/14/2020    ECHOCARDIOGRAM REPORT   Patient Name:   Muskaan Leth Date of Exam: 06/14/2020 Medical Rec #:  [id number]   Height:       64.0 in Accession #:    [id number]  Weight:       190.0 lb Date of Birth:  [birth year]   BSA:          1.914 m Patient Age:  33 years    BP:           115/80 mmHg Patient Gender: F           HR:           91 bpm. Exam Location:  Inpatient Procedure: 2D Echo Indications:    Elevated Troponin  History:        Patient has no prior history of Echocardiogram examinations.                 HIV, Acute kidney injury, Microcytic hypochromic anemia.  Sonographer:    Leta Jungling RDCS Sonographer#2:  Eulah Pont RDCS Referring Phys: [id number] Kiamesha Samet U Tarig Zimmers IMPRESSIONS  1.  Left ventricular ejection fraction, by estimation, is 55 to 60%. The left ventricle has normal function. The left ventricle has no regional wall motion abnormalities. Left ventricular diastolic parameters were normal.  2. Right ventricular systolic function is normal. The right ventricular size is normal. Tricuspid regurgitation signal is inadequate for assessing PA pressure.  3. The mitral valve is grossly normal. No evidence of mitral valve regurgitation. No evidence of mitral stenosis.  4. The aortic valve is tricuspid. Aortic valve regurgitation is not visualized. No aortic stenosis is present.  5. The inferior vena cava is normal in size with greater than 50% respiratory variability, suggesting right atrial pressure of 3 mmHg. Conclusion(s)/Recommendation(s): Normal biventricular function without evidence of hemodynamically significant valvular heart disease. FINDINGS  Left Ventricle: Left ventricular ejection fraction, by estimation, is 55 to 60%. The left ventricle has normal function. The left ventricle has no regional wall motion abnormalities. The left ventricular internal cavity size was normal in size. There is  no left ventricular hypertrophy. Left ventricular diastolic parameters were normal. Right Ventricle: The right ventricular size is normal. No increase in right ventricular wall thickness. Right ventricular systolic function is normal. Tricuspid regurgitation signal is inadequate for assessing PA pressure. Left Atrium: Left atrial size was normal in size. Right Atrium: Right atrial size was normal in size. Pericardium: Trivial pericardial effusion is present. Mitral Valve: The mitral valve is grossly normal. No evidence of mitral valve regurgitation. No evidence of mitral valve stenosis. Tricuspid Valve: The tricuspid valve is grossly normal. Tricuspid valve regurgitation is trivial. No evidence of tricuspid stenosis. Aortic Valve: The aortic valve is tricuspid. Aortic valve regurgitation is not  visualized. No aortic stenosis is present. Pulmonic Valve: The pulmonic valve was grossly normal. Pulmonic valve regurgitation is not visualized. No evidence of pulmonic stenosis. Aorta: The aortic root and ascending aorta are structurally normal, with no evidence of dilitation. Venous: The inferior vena cava is normal in size with greater than 50% respiratory variability, suggesting right atrial pressure of 3 mmHg. IAS/Shunts: The atrial septum is grossly normal.  LEFT VENTRICLE PLAX 2D LVIDd:         4.80 cm  Diastology LVIDs:         3.00 cm  LV e' lateral:   12.70 cm/s LV PW:         0.80 cm  LV E/e' lateral: 4.3 LV IVS:        0.90 cm  LV e' medial:    6.64 cm/s LVOT diam:     2.10 cm  LV E/e' medial:  8.3 LV SV:         53 LV SV Index:   28 LVOT Area:     3.46 cm  RIGHT VENTRICLE RV S prime:     14.10 cm/s TAPSE (M-mode): 2.1  cm LEFT ATRIUM             Index       RIGHT ATRIUM           Index LA diam:        2.80 cm 1.46 cm/m  RA Area:     11.50 cm LA Vol (A2C):   36.4 ml 19.02 ml/m RA Volume:   26.80 ml  14.00 ml/m LA Vol (A4C):   32.1 ml 16.77 ml/m LA Biplane Vol: 36.3 ml 18.96 ml/m  AORTIC VALVE LVOT Vmax:   106.00 cm/s LVOT Vmean:  65.900 cm/s LVOT VTI:    0.154 m  AORTA Ao Root diam: 3.20 cm MITRAL VALVE MV Area (PHT): 3.68 cm    SHUNTS MV Decel Time: 206 msec    Systemic VTI:  0.15 m MV E velocity: 54.80 cm/s  Systemic Diam: 2.10 cm MV A velocity: 63.40 cm/s MV E/A ratio:  0.86 Lennie Odor MD Electronically signed by Lennie Odor MD Signature Date/Time: 06/14/2020/4:44:18 PM    Final     Medications:   . sodium chloride   Intravenous Once  . Chlorhexidine Gluconate Cloth  6 each Topical Daily  . dapsone  100 mg Oral Daily  . dolutegravir  50 mg Oral Daily  . emtricitabine-tenofovir AF  1 tablet Oral Daily  . metroNIDAZOLE  500 mg Oral Q12H  . predniSONE  80 mg Oral Q breakfast  . sodium chloride flush  3 mL Intravenous Once  . sodium chloride flush  3 mL Intravenous Q12H    Continuous Infusions: . citrate dextrose       LOS: 4 days   Joseph Art  [facility]   How to contact the [facility] Attending or Consulting provider 7A - 7P or covering provider during after hours 7P -7A, for this patient?  1. Check the care team in [facility] and look for a) attending/consulting [facility] provider listed and b) the [facility] team listed 2. Log into [email or web address] and use [facility] universal password to access. If you do not have the password, please contact the hospital operator. 3. Locate the [facility] provider you are looking for under [facility] and page to a number that you can be directly reached. 4. If you still have difficulty reaching the provider, please page the [facility] (Director on Call) for the Hospitalists listed on amion for assistance.  06/16/2020, 9:40 AM

## 2020-06-16 NOTE — Progress Notes (Addendum)
IP PROGRESS NOTE  Subjective:   Catherine Martinez reports feeling well without any complaints at this time.  She continues to tolerate plasma exchange without any issues.  She denies any nausea vomiting neuropathy.  She denies any pain along her catheter or bleeding issues.  She denies any fevers or flank pain.  Overall she continues to recover appropriately.  Objective:  Vital signs in last 24 hours: Temp:  [97.6 F (36.4 C)-98.8 F (37.1 C)] 98.7 F (37.1 C) (08/27 0729) Pulse Rate:  [74-105] 82 (08/27 0729) Resp:  [15-26] 17 (08/27 0729) BP: (111-128)/(65-93) 122/75 (08/27 0729) SpO2:  [98 %-100 %] 100 % (08/27 0729) Weight change:  Last BM Date: 06/12/20  Intake/Output from previous day: 08/26 0701 - 08/27 0700 In: 600 [P.O.:600] Out: -  General: Alert, awake without distress. Head: Normocephalic atraumatic. Mouth: mucous membranes moist, pharynx normal without lesions Eyes: No scleral icterus.  Pupils are equal and round reactive to light. Resp: clear to auscultation bilaterally without rhonchi or wheezes or dullness to percussion. Cardio: regular rate and rhythm, S1, S2 normal, no murmur, click, rub or gallop GI: soft, non-tender; bowel sounds normal; no masses,  no organomegaly Musculoskeletal: No joint deformity or effusion. Neurological: No motor, sensory deficits.  Intact deep tendon reflexes. Skin: No rashes or lesions.  Dialysis catheter site without erythema  Lab Results: Recent Labs    06/15/20 0342 06/15/20 0342 06/15/20 1512 06/16/20 0340  WBC 12.4*  --   --  11.5*  HGB 8.1*   < > 12.6 7.3*  HCT 26.1*   < > 37.0 23.9*  PLT 167  --   --  227   < > = values in this interval not displayed.    BMET Recent Labs    06/15/20 0342 06/15/20 0342 06/15/20 1512 06/16/20 0340  NA 139   < > 143 138  K 4.3   < > 3.6 3.5  CL 103   < > 97* 101  CO2 27  --   --  29  GLUCOSE 107*   < > 144* 99  BUN 16   < > 15 15  CREATININE 1.08*   < > 1.00 0.98  CALCIUM 8.7*   --   --  8.5*   < > = values in this interval not displayed.    Studies/Results: ECHOCARDIOGRAM COMPLETE  Result Date: 06/14/2020    ECHOCARDIOGRAM REPORT   Patient Name:   Catherine Martinez Date of Exam: 06/14/2020 Medical Rec #:  [id number]   Height:       64.0 in Accession #:    [id number]  Weight:       190.0 lb Date of Birth:  09-02-1986   BSA:          1.914 m Patient Age:    33 years    BP:           115/80 mmHg Patient Gender: F           HR:           91 bpm. Exam Location:  Inpatient Procedure: 2D Echo Indications:    Elevated Troponin  History:        Patient has no prior history of Echocardiogram examinations.                 HIV, Acute kidney injury, Microcytic hypochromic anemia.  Sonographer:    Leta Jungling RDCS Sonographer#2:  Eulah Pont RDCS Referring Phys: [id number] JESSICA U VANN IMPRESSIONS  1. Left  ventricular ejection fraction, by estimation, is 55 to 60%. The left ventricle has normal function. The left ventricle has no regional wall motion abnormalities. Left ventricular diastolic parameters were normal.  2. Right ventricular systolic function is normal. The right ventricular size is normal. Tricuspid regurgitation signal is inadequate for assessing PA pressure.  3. The mitral valve is grossly normal. No evidence of mitral valve regurgitation. No evidence of mitral stenosis.  4. The aortic valve is tricuspid. Aortic valve regurgitation is not visualized. No aortic stenosis is present.  5. The inferior vena cava is normal in size with greater than 50% respiratory variability, suggesting right atrial pressure of 3 mmHg. Conclusion(s)/Recommendation(s): Normal biventricular function without evidence of hemodynamically significant valvular heart disease. FINDINGS  Left Ventricle: Left ventricular ejection fraction, by estimation, is 55 to 60%. The left ventricle has normal function. The left ventricle has no regional wall motion abnormalities. The left ventricular internal cavity size was  normal in size. There is  no left ventricular hypertrophy. Left ventricular diastolic parameters were normal. Right Ventricle: The right ventricular size is normal. No increase in right ventricular wall thickness. Right ventricular systolic function is normal. Tricuspid regurgitation signal is inadequate for assessing PA pressure. Left Atrium: Left atrial size was normal in size. Right Atrium: Right atrial size was normal in size. Pericardium: Trivial pericardial effusion is present. Mitral Valve: The mitral valve is grossly normal. No evidence of mitral valve regurgitation. No evidence of mitral valve stenosis. Tricuspid Valve: The tricuspid valve is grossly normal. Tricuspid valve regurgitation is trivial. No evidence of tricuspid stenosis. Aortic Valve: The aortic valve is tricuspid. Aortic valve regurgitation is not visualized. No aortic stenosis is present. Pulmonic Valve: The pulmonic valve was grossly normal. Pulmonic valve regurgitation is not visualized. No evidence of pulmonic stenosis. Aorta: The aortic root and ascending aorta are structurally normal, with no evidence of dilitation. Venous: The inferior vena cava is normal in size with greater than 50% respiratory variability, suggesting right atrial pressure of 3 mmHg. IAS/Shunts: The atrial septum is grossly normal.  LEFT VENTRICLE PLAX 2D LVIDd:         4.80 cm  Diastology LVIDs:         3.00 cm  LV e' lateral:   12.70 cm/s LV PW:         0.80 cm  LV E/e' lateral: 4.3 LV IVS:        0.90 cm  LV e' medial:    6.64 cm/s LVOT diam:     2.10 cm  LV E/e' medial:  8.3 LV SV:         53 LV SV Index:   28 LVOT Area:     3.46 cm  RIGHT VENTRICLE RV S prime:     14.10 cm/s TAPSE (M-mode): 2.1 cm LEFT ATRIUM             Index       RIGHT ATRIUM           Index LA diam:        2.80 cm 1.46 cm/m  RA Area:     11.50 cm LA Vol (A2C):   36.4 ml 19.02 ml/m RA Volume:   26.80 ml  14.00 ml/m LA Vol (A4C):   32.1 ml 16.77 ml/m LA Biplane Vol: 36.3 ml 18.96 ml/m   AORTIC VALVE LVOT Vmax:   106.00 cm/s LVOT Vmean:  65.900 cm/s LVOT VTI:    0.154 m  AORTA Ao Root diam: 3.20 cm MITRAL  VALVE MV Area (PHT): 3.68 cm    SHUNTS MV Decel Time: 206 msec    Systemic VTI:  0.15 m MV E velocity: 54.80 cm/s  Systemic Diam: 2.10 cm MV A velocity: 63.40 cm/s MV E/A ratio:  0.86 Lennie Odor MD Electronically signed by Lennie Odor MD Signature Date/Time: 06/14/2020/4:44:18 PM    Final     Medications: I have reviewed the patient's current medications.  Assessment/Plan:  34 year old woman with:  1.  TTP presented with severe thrombocytopenia and elevated LDH and schistocytes on the peripheral smear.  He is currently receiving plasma exchange with prednisone.  She has achieved a complete response rather rapidly with a normalization of her platelet count and near normalization of her LDH.  I recommended continuing plasma exchange for a total of 3 treatments after normal platelet count. She will receive a plasma exchange today and again on 8/28.  If her platelet counts continues to be normal tomorrow, her dialysis catheter can be removed and she can be discharged after 8/28 plasma exchange session.  Prednisone taper will start after discontinuation of plasma exchange by 20 mg a week.  This was discussed with the patient today.   2.  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  Iron studies do not show evidence of iron deficiency at this time.  Her hemoglobin electrophoresis did not show any clear-cut of hemoglobinopathy completed in 2014.  I recommend continued monitoring and transfuse if her hematocrit is less than 24 in order to receive plasma exchange. She will received 1 unit PRBCs today.   3.  HIV: Dr. Drue Second is following.  He does have a increased viral load and depressed CD4 and was started on appropriate antiretroviral treatment.  4.  Follow-up and disposition: I  anticipate discharge after Saturdays plasma exchange if she continues to have normal platelet count.  We will arrange follow-up at the cancer center for repeat testing next week.    LOS: 4 days   Clenton Pare 06/16/2020, 1:02 PM  I agree with the note above.  Plan is to proceed with plasma exchange on August 27 and August 28.  She can be discharged at that time and I will arrange follow-up at the cancer center next week upon her discharge.  Prednisone can be tapered by 20 mg a week.  She can be discharged at 40 mg daily.  Eli Hose MD

## 2020-06-16 NOTE — Progress Notes (Signed)
DBIV consult: Scanned bracelet, no lab orders noted. Cancel consult.

## 2020-06-16 NOTE — Plan of Care (Signed)

## 2020-06-16 NOTE — Progress Notes (Signed)
Labs reviewed today.  Her platelet count continues to improve and is normalized at this time.  Her LDH is normal as well with normal electrolytes and kidney function. Her hemoglobin is down to seven-point with a hematocrit of 23.9.  I recommended transfusing 1 unit of packed red cell to allow her to finish her plasma exchange on 8/28 and 8/29.

## 2020-06-16 NOTE — Telephone Encounter (Signed)
Scheduled apt per 8/27 sch msg - pt is aware of appt date and time

## 2020-06-16 NOTE — Progress Notes (Signed)
Per HD nurse, HD does not administer RBC while in treatment. Advised to wait to hang RBC until pt gets back from HD due to it delaying treatment if it is running prior to HD. Dr. Benjamine Mola notified, will hang 1 unit of RBC once pt gets back from HD.

## 2020-06-16 NOTE — Progress Notes (Signed)
1645 Received pt back from Electrophoresis. Right IJ cath intact. Pt denies any discomfort.  1745 1 Unit of PRBC started.

## 2020-06-17 LAB — THERAPEUTIC PLASMA EXCHANGE (BLOOD BANK)
Plasma Exchange: 3100
Plasma volume needed: 3100
Unit division: 0
Unit division: 0
Unit division: 0
Unit division: 0
Unit division: 0
Unit division: 0
Unit division: 0
Unit division: 0

## 2020-06-17 LAB — CBC
HCT: 28.4 % — ABNORMAL LOW (ref 36.0–46.0)
Hemoglobin: 8.6 g/dL — ABNORMAL LOW (ref 12.0–15.0)
MCH: 24.6 pg — ABNORMAL LOW (ref 26.0–34.0)
MCHC: 30.3 g/dL (ref 30.0–36.0)
MCV: 81.1 fL (ref 80.0–100.0)
Platelets: 279 10*3/uL (ref 150–400)
RBC: 3.5 MIL/uL — ABNORMAL LOW (ref 3.87–5.11)
RDW: 18.7 % — ABNORMAL HIGH (ref 11.5–15.5)
WBC: 13.8 10*3/uL — ABNORMAL HIGH (ref 4.0–10.5)
nRBC: 1.8 % — ABNORMAL HIGH (ref 0.0–0.2)

## 2020-06-17 LAB — LACTATE DEHYDROGENASE: LDH: 194 U/L — ABNORMAL HIGH (ref 98–192)

## 2020-06-17 LAB — TYPE AND SCREEN
ABO/RH(D): O POS
Antibody Screen: NEGATIVE
Unit division: 0

## 2020-06-17 LAB — BASIC METABOLIC PANEL
Anion gap: 7 (ref 5–15)
BUN: 15 mg/dL (ref 6–20)
CO2: 27 mmol/L (ref 22–32)
Calcium: 8.2 mg/dL — ABNORMAL LOW (ref 8.9–10.3)
Chloride: 101 mmol/L (ref 98–111)
Creatinine, Ser: 0.94 mg/dL (ref 0.44–1.00)
GFR calc Af Amer: >60 mL/min (ref >60)
GFR calc non Af Amer: >60 mL/min (ref >60)
Glucose, Bld: 105 mg/dL — ABNORMAL HIGH (ref 70–99)
Potassium: 3.7 mmol/L (ref 3.5–5.1)
Sodium: 135 mmol/L (ref 135–145)

## 2020-06-17 LAB — BPAM RBC
Blood Product Expiration Date: 202109222359
ISSUE DATE / TIME: 202108271729
Unit Type and Rh: 5100

## 2020-06-17 MED ORDER — PREDNISONE 20 MG PO TABS: 40.0000 mg | ORAL_TABLET | Freq: Every day | ORAL | Status: DC

## 2020-06-17 MED ORDER — ACD FORMULA A 0.73-2.45-2.2 GM/100ML VI SOLN
Status: AC
  Administered 2020-06-17: 1000 mL
  Filled 2020-06-17: qty 500

## 2020-06-17 MED ORDER — ANTICOAGULANT SODIUM CITRATE 4% (200MG/5ML) IV SOLN
5.0000 mL | Freq: Once | Status: DC
  Filled 2020-06-17: qty 5

## 2020-06-17 MED ORDER — CALCIUM GLUCONATE-NACL 2-0.675 GM/100ML-% IV SOLN
2.0000 g | Freq: Once | INTRAVENOUS | Status: AC
  Administered 2020-06-17: 2000 mg via INTRAVENOUS
  Filled 2020-06-17 (×2): qty 100

## 2020-06-17 MED ORDER — PREDNISONE 20 MG PO TABS: 20.0000 mg | ORAL_TABLET | Freq: Every day | ORAL | Status: DC

## 2020-06-17 MED ORDER — DIPHENHYDRAMINE HCL 25 MG PO CAPS: 25.0000 mg | ORAL_CAPSULE | Freq: Four times a day (QID) | ORAL | Status: DC | PRN

## 2020-06-17 MED ORDER — FOLIC ACID 1 MG PO TABS
2.0000 mg | ORAL_TABLET | Freq: Every day | ORAL | Status: DC
  Administered 2020-06-17: 2 mg via ORAL
  Filled 2020-06-17: qty 2

## 2020-06-17 MED ORDER — ACD FORMULA A 0.73-2.45-2.2 GM/100ML VI SOLN: 1000.0000 mL | Status: DC

## 2020-06-17 MED ORDER — CALCIUM CARBONATE ANTACID 500 MG PO CHEW
CHEWABLE_TABLET | ORAL | Status: AC
  Administered 2020-06-17: 400 mg via ORAL
  Filled 2020-06-17: qty 4

## 2020-06-17 MED ORDER — DIPHENHYDRAMINE HCL 25 MG PO CAPS
ORAL_CAPSULE | ORAL | Status: AC
  Administered 2020-06-17: 25 mg via ORAL
  Filled 2020-06-17: qty 1

## 2020-06-17 MED ORDER — PREDNISONE 20 MG PO TABS: ORAL_TABLET | ORAL | 0 refills | Status: DC

## 2020-06-17 MED ORDER — CALCIUM CARBONATE ANTACID 500 MG PO CHEW
2.0000 | CHEWABLE_TABLET | ORAL | Status: AC
  Administered 2020-06-17: 400 mg via ORAL
  Filled 2020-06-17: qty 2

## 2020-06-17 MED ORDER — PREDNISONE 20 MG PO TABS
20.0000 mg | ORAL_TABLET | ORAL | Status: DC
  Filled 2020-06-17: qty 21

## 2020-06-17 MED ORDER — ACETAMINOPHEN 325 MG PO TABS: 650.0000 mg | ORAL_TABLET | ORAL | Status: DC | PRN

## 2020-06-17 NOTE — Progress Notes (Signed)
Catherine Martinez is a very nice. Catherine Martinez has responded very well to the plasma exchange. Her platelet count has come up quite nicely.  Hopefully Catherine Martinez will go home today.  Catherine Martinez has 4 children. Catherine Martinez really wants to be able to be home with them.  Catherine Martinez has had no fever. Her appetite is doing okay. Catherine Martinez has had no nausea or vomiting. There has been no bleeding. There is been no diarrhea. Catherine Martinez has had no cough. There is no shortness of breath.  Catherine Martinez does have HIV. I would have to think this is under fairly good control.  Her labs today show elevation of 194. BUN is 15 creatinine 0.94. White cell count 13.8. Hemoglobin 8.6. Platelet count 279,000.  Her vital signs show temperature of 97.7. Heart rate 65. Blood pressure 118/88. Head and neck exam shows no scleral icterus. There is no adenopathy in the neck. Lungs are clear. Cardiac exam regular rate and rhythm. There are no murmurs. Abdomen is soft. Bowel sounds are present. There is no guarding or rebound tenderness. Extremities show some slight pitting edema in the lower legs. Catherine Martinez has good strength in upper lower extremities. Catherine Martinez has good range of motion. Neurological exam is nonfocal. Skin exam shows no rashes.  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 I would think this is reflective of the HIV. The ADAMTS-13 is pending. This probably will not come back until next week sometime.  Catherine Martinez has responded very nicely. Her LDH is come down from 1400. Her platelet counts come up from 6.  Catherine Martinez will have the plasma exchange today. Catherine Martinez will then hopefully go home. I would suspect Catherine Martinez will get plasma exchange as an outpatient on a tapered schedule.  Christin Bach, MD  [place] 14:14

## 2020-06-17 NOTE — Plan of Care (Signed)
  Problem: Education: Goal: Knowledge of General Education information will improve Description: Including pain rating scale, medication(s)/side effects and non-pharmacologic comfort measures Outcome: Progressing   Problem: Health Behavior/Discharge Planning: Goal: Ability to manage health-related needs will improve Outcome: Progressing   Problem: Clinical Measurements: Goal: Ability to maintain clinical measurements within normal limits will improve Outcome: Progressing   Problem: Nutrition: Goal: Adequate nutrition will be maintained Outcome: Progressing   Problem: Coping: Goal: Level of anxiety will decrease Outcome: Progressing   Problem: Elimination: Goal: Will not experience complications related to bowel motility Outcome: Progressing   Problem: Pain Managment: Goal: General experience of comfort will improve Outcome: Progressing

## 2020-06-17 NOTE — Plan of Care (Signed)

## 2020-06-17 NOTE — Discharge Summary (Signed)
Physician Discharge Summary  Catherine Martinez ZOX:[id number] DOB: Dec 03, 1985 DOA: 06/11/2020  PCP: [facility] Admit date: 06/11/2020 Discharge date: 06/17/2020  Admitted From: home Discharge disposition: home   Recommendations for Outpatient Follow-Up:   1. plt check in 1 week 2. Prednisone taper   Discharge Diagnosis:   Principal Problem:   TTP (thrombotic thrombocytopenic purpura) (HCC) Active Problems:   HIV INFECTION   Trichomonas infection   Transaminitis   Acute lower UTI   Acute kidney injury superimposed on CKD (HCC)   Microcytic hypochromic anemia   Hyperbilirubinemia    Discharge Condition: Improved.  Diet recommendation: .  Regular.  Wound care: None.  Code status: Full.   History of Present Illness:   Catherine Martinez is a 34 y.o. female with medical history significant of HIV, UTIs, and anemia presents with complaints of dizziness.  Last week she had noticed a strong odor to her urine and was having back pain.  She was evaluated 5 days ago and found to have signs of a urinary tract infection/pyelonephritis started on ciprofloxacin.  Last urinary tract infection was over a year ago.  She has been taking the medication as advised and only gets nauseous with it.  She reports that the back pain had been getting better.  However, after taking a dose of the medicine yesterday morning felt as though she may pass out.  Noted associated symptoms of a headache, but that has since resolved.  Denies having any loss of consciousness, vomiting, chest pain, shortness of breath, cough, diarrhea, easy bruising, or bleeding.  She came to the emergency department for further evaluation as symptoms persisted.  Patient admits that she has not been seen by infectious disease in over a year after losing her insurance.  However, she has been taking her antiretroviral medications as prescribed.   Hospital Course by Problem:    TTP: Thrombocytopenia with elevated LDH, indirect bilirubin  and mild renal insufficiency. Underwent right IJ catheter placement by IR with no complications of bleeding. Coombs testing, haptoglobin, andADAMTS13level sent. Seen by hematology and patient receiving oral prednisone and plasmapheresis (lastly on  8/26, 8/27 and 8/28) -prednisone will need to be tapered by 20 q weekly after plasmapheresis stopped (starting at 40 mg)  HIV disease: -has not been seen by infectious disease in over a year after losing her insurance. However, she has been taking her antiretroviral medications as prescribed.Appreciate infectious diseases evaluation and management-medication regimen changed and viral load/CD4 counts sent.  Microcytic anemia: Iron level 154 and percent saturations elevated at 51, normal TIBC with ferritin at 910. Possibly related to problem #1.  -s/p 2 units PRBCs -Patient apparently was on iron supplements previously. Monitor for now.  Hyperbilirubinemia: Indirect bilirubin greater than direct indicative of hemolysis. Elevated LDH also indicates the same. Appears to be improving slowly.  Abnormal UA with trichomonas: Being treated with IV ceftriaxone for UTI x3 days given complaints of dysuria and UA showing pyuria/bacteriuria (has already been treated with cipro outpatient) -urine culture w/o growth -flagyl for trichomonas x 7 days  Troponinemia:  -Unclear significance.  -echo normal  obesity Body mass index is 32.61 kg/m.   Medical Consultants:    [facility]  Discharge Exam:   Vitals:   06/17/20 1457 06/17/20 1506  BP: 115/81 115/81  Pulse:    Resp:  (!) 27  Temp: 98.1 F (36.7 C) 98.1 F (36.7 C)  SpO2:     Vitals:   06/17/20 1437 06/17/20 1446 06/17/20 1457  06/17/20 1506  BP: 123/89 123/89 115/81 115/81  Pulse:      Resp: (!) 25 (!) 24  (!) 27  Temp: 98.1 F (36.7 C) 98.1 F (36.7 C) 98.1 F (36.7 C) 98.1 F (36.7 C)  TempSrc: Oral Oral Oral   SpO2:      Weight:      Height:        General  exam: Appears calm and comfortable.    The results of significant diagnostics from this hospitalization (including imaging, microbiology, ancillary and laboratory) are listed below for reference.     Procedures and Diagnostic Studies:   CT Head Wo Contrast  Result Date: 06/12/2020 CLINICAL DATA:  Dizziness. Hypotension. Cerebral hemorrhage suspected. EXAM: CT HEAD WITHOUT CONTRAST TECHNIQUE: Contiguous axial images were obtained from the base of the skull through the vertex without intravenous contrast. COMPARISON:  None. FINDINGS: Brain: No acute infarct, hemorrhage, or mass lesion is present. No significant white matter lesions are present. The ventricles are of normal size. No significant extraaxial fluid collection is present. The brainstem and cerebellum are within normal limits. Vascular: No hyperdense vessel or unexpected calcification. Skull: Vertebral body heights and alignment are normal. Calvarium is intact. No focal lytic or blastic lesions are present. No significant extracranial soft tissue lesion is present. Sinuses/Orbits: The paranasal sinuses and mastoid air cells are clear. The globes and orbits are within normal limits. IMPRESSION: Negative CT of the head. Electronically Signed   By: Marin Roberts M.D.   On: 06/12/2020 04:43   IR Fluoro Guide CV Line Right  Result Date: 06/12/2020 CLINICAL DATA:  TTP, needs venous access for pheresis EXAM: EXAM RIGHT IJ CATHETER PLACEMENT UNDER ULTRASOUND AND FLUOROSCOPIC GUIDANCE TECHNIQUE: The procedure, risks (including but not limited to bleeding, infection, organ damage, pneumothorax), benefits, and alternatives were explained to the patient. Questions regarding the procedure were encouraged and answered. The patient understands and consents to the procedure. Patency of the right IJ vein was confirmed with ultrasound with image documentation. An appropriate skin site was determined. Skin site was marked. Region was prepped using maximum  barrier technique including cap and mask, sterile gown, sterile gloves, large sterile sheet, and Chlorhexidine as cutaneous antisepsis. The region was infiltrated locally with 1% lidocaine. Under real-time ultrasound guidance, the right IJ vein was accessed with a 19 gauge needle; the needle tip within the vein was confirmed with ultrasound image documentation. The needle exchanged over a guidewire for vascular dilator which allowed advancement of a 16 cm Trialysis catheter. This was positioned with the tip at the cavoatrial junction. Spot chest radiograph shows good positioning and no pneumothorax. Catheter was flushed and sutured externally with 0-Prolene sutures. Patient tolerated the procedure well. FLUOROSCOPY TIME:  6 seconds, less than 0.1 mGy COMPLICATIONS: COMPLICATIONS none IMPRESSION: 1. Technically successful right IJ Trialysis catheter placement. Electronically Signed   By: Corlis Leak M.D.   On: 06/12/2020 14:12   IR US Guide Vasc Access Right  Result Date: 06/12/2020 CLINICAL DATA:  TTP, needs venous access for pheresis EXAM: EXAM RIGHT IJ CATHETER PLACEMENT UNDER ULTRASOUND AND FLUOROSCOPIC GUIDANCE TECHNIQUE: The procedure, risks (including but not limited to bleeding, infection, organ damage, pneumothorax), benefits, and alternatives were explained to the patient. Questions regarding the procedure were encouraged and answered. The patient understands and consents to the procedure. Patency of the right IJ vein was confirmed with ultrasound with image documentation. An appropriate skin site was determined. Skin site was marked. Region was prepped using maximum barrier technique including cap  and mask, sterile gown, sterile gloves, large sterile sheet, and Chlorhexidine as cutaneous antisepsis. The region was infiltrated locally with 1% lidocaine. Under real-time ultrasound guidance, the right IJ vein was accessed with a 19 gauge needle; the needle tip within the vein was confirmed with ultrasound  image documentation. The needle exchanged over a guidewire for vascular dilator which allowed advancement of a 16 cm Trialysis catheter. This was positioned with the tip at the cavoatrial junction. Spot chest radiograph shows good positioning and no pneumothorax. Catheter was flushed and sutured externally with 0-Prolene sutures. Patient tolerated the procedure well. FLUOROSCOPY TIME:  6 seconds, less than 0.1 mGy COMPLICATIONS: COMPLICATIONS none IMPRESSION: 1. Technically successful right IJ Trialysis catheter placement. Electronically Signed   By: Corlis Leak M.D.   On: 06/12/2020 14:12     Labs:   Basic Metabolic Panel: Recent Labs  Lab 06/13/20 0646 06/13/20 0646 06/14/20 1307 06/14/20 1307 06/15/20 0342 06/15/20 0342 06/15/20 1512 06/15/20 1512 06/16/20 0340 06/16/20 0340 06/16/20 1350 06/17/20 0310  NA 140   < > 139   < > 139  --  143  --  138  --  141 135  K 3.5   < > 3.6   < > 4.3   < > 3.6   < > 3.5   < > 3.4* 3.7  CL 102   < > 102   < > 103  --  97*  --  101  --  96* 101  CO2 27  --  26  --  27  --   --   --  29  --   --  27  GLUCOSE 109*   < > 114*   < > 107*  --  144*  --  99  --  155* 105*  BUN 18   < > 15   < > 16  --  15  --  15  --  12 15  CREATININE 1.11*   < > 1.20*   < > 1.08*  --  1.00  --  0.98  --  0.90 0.94  CALCIUM 9.4  --  8.5*  --  8.7*  --   --   --  8.5*  --   --  8.2*   < > = values in this interval not displayed.   GFR Estimated Creatinine Clearance: 88.7 mL/min (by C-G formula based on SCr of 0.94 mg/dL). Liver Function Tests: Recent Labs  Lab 06/12/20 0232 06/13/20 0646  AST 76* 40  ALT 44 27  ALKPHOS 54 46  BILITOT 7.3* 3.7*  PROT 8.7* 6.9  ALBUMIN 3.4* 3.4*   No results for input(s): LIPASE, AMYLASE in the last 168 hours. No results for input(s): AMMONIA in the last 168 hours. Coagulation profile Recent Labs  Lab 06/12/20 0232  INR 1.3*    CBC: Recent Labs  Lab 06/11/20 1622 06/12/20 0232 06/13/20 0646 06/13/20 0646  06/14/20 1210 06/14/20 1210 06/15/20 0342 06/15/20 1512 06/16/20 0340 06/16/20 1350 06/17/20 0310  WBC 5.4  --  6.8  --  10.0  --  12.4*  --  11.5*  --  13.8*  NEUTROABS 2.9  --   --   --   --   --   --   --   --   --   --   HGB 8.2*  --  8.5*   < > 8.4*   < > 8.1* 12.6 7.3* 8.8* 8.6*  HCT 25.7*  --  26.2*   < > 26.6*   < > 26.1* 37.0 23.9* 26.0* 28.4*  MCV 74.3*  --  73.2*  --  76.0*  --  76.8*  --  80.5  --  81.1  PLT 6*   < > 26*  --  122*  --  167  --  227  --  279   < > = values in this interval not displayed.   Cardiac Enzymes: No results for input(s): CKTOTAL, CKMB, CKMBINDEX, TROPONINI in the last 168 hours. BNP: Invalid input(s): POCBNP CBG: No results for input(s): GLUCAP in the last 168 hours. D-Dimer No results for input(s): DDIMER in the last 72 hours. Hgb A1c No results for input(s): HGBA1C in the last 72 hours. Lipid Profile No results for input(s): CHOL, HDL, LDLCALC, TRIG, CHOLHDL, LDLDIRECT in the last 72 hours. Thyroid function studies No results for input(s): TSH, T4TOTAL, T3FREE, THYROIDAB in the last 72 hours.  Invalid input(s): FREET3 Anemia work up No results for input(s): VITAMINB12, FOLATE, FERRITIN, TIBC, IRON, RETICCTPCT in the last 72 hours. Microbiology Recent Results (from the past 240 hour(s))  Culture, Urine     Status: Abnormal   Collection Time: 06/12/20  5:21 AM   Specimen: Urine, Random  Result Value Ref Range Status   Specimen Description URINE, RANDOM  Final   Special Requests NONE  Final   Culture (A)  Final    <10,000 COLONIES/mL INSIGNIFICANT GROWTH Performed at [facility], [address] [place], [place] [address]    Report Status 06/14/2020 FINAL  Final  SARS Coronavirus 2 by RT PCR (hospital order, performed in [facility] hospital lab) Nasopharyngeal Nasopharyngeal Swab     Status: None   Collection Time: 06/12/20  8:03 AM   Specimen: Nasopharyngeal Swab  Result Value Ref Range Status   SARS Coronavirus 2  NEGATIVE NEGATIVE Final    Comment: (NOTE) SARS-CoV-2 target nucleic acids are NOT DETECTED.  The SARS-CoV-2 RNA is generally detectable in upper and lower respiratory specimens during the acute phase of infection. The lowest concentration of SARS-CoV-2 viral copies this assay can detect is 250 copies / mL. A negative result does not preclude SARS-CoV-2 infection and should not be used as the sole basis for treatment or other patient management decisions.  A negative result may occur with improper specimen collection / handling, submission of specimen other than nasopharyngeal swab, presence of viral mutation(s) within the areas targeted by this assay, and inadequate number of viral copies (<250 copies / mL). A negative result must be combined with clinical observations, patient history, and epidemiological information.  Fact Sheet for Patients:   BoilerBrush.com.cy  Fact Sheet for Healthcare Providers: https://pope.com/  This test is not yet approved or  cleared by the [place] FDA and has been authorized for detection and/or diagnosis of SARS-CoV-2 by FDA under an Emergency Use Authorization (EUA).  This EUA will remain in effect (meaning this test can be used) for the duration of the COVID-19 declaration under Section 564(b)(1) of the Act, 21 U.S.C. section 360bbb-3(b)(1), unless the authorization is terminated or revoked sooner.  Performed at [facility], [address] [place], [place] [address]      Discharge Instructions:   Discharge Instructions    Diet general   Complete by: As directed    Increase activity slowly   Complete by: As directed    No wound care   Complete by: As directed      Allergies as of  06/17/2020      Reactions   Sulfamethoxazole-trimethoprim Hives      Medication List    STOP taking these medications   acyclovir 400 MG tablet Commonly known as: ZOVIRAX   ciprofloxacin 500  MG tablet Commonly known as: CIPRO   ibuprofen 600 MG tablet Commonly known as: ADVIL   valACYclovir 1000 MG tablet Commonly known as: VALTREX     TAKE these medications   dapsone 100 MG tablet Take 1 tablet (100 mg total) by mouth daily.   Descovy 200-25 MG tablet Generic drug: emtricitabine-tenofovir AF Take 1 tablet by mouth daily.   metroNIDAZOLE 500 MG tablet Commonly known as: FLAGYL Take 1 tablet (500 mg total) by mouth every 12 (twelve) hours for 2 days. Start taking on: June 18, 2020   predniSONE 20 MG tablet Commonly known as: DELTASONE Take 40 mg or2 tablets every morning with breakfast for 7 days,then take 20 mg or 1 tablet every morning with breakfast for 7 days,then stop.   Tivicay 50 MG tablet Generic drug: dolutegravir Take 1 tablet (50 mg total) by mouth daily.       Follow-up Information    [facility] Follow up on 07/18/2020.   Why: You have a hospital follow up appointment with the clinic on Tuesday, September 28 at 0930 am. Contact information: [address] [place] [address] [phone]       Benjiman Core, MD. Schedule an appointment as soon as possible for a visit in 1 week(s).   Specialty: Oncology Contact information: [address] [place] [address] [phone]                Time coordinating discharge: 35 min  Signed:  Joseph Art DO  [facility] 06/17/2020, 3:06 PM

## 2020-06-17 NOTE — Progress Notes (Signed)
Provided discharge education/instructions, all questions and concerns addressed, Pt not in distress. Meds picked up from pharmacy and handed to Pt, IV team discontinued central line. Pt to discharge home accompanied by sister.

## 2020-06-18 LAB — THERAPEUTIC PLASMA EXCHANGE (BLOOD BANK)
Plasma Exchange: 3100
Plasma volume needed: 3100
Unit division: 0
Unit division: 0
Unit division: 0
Unit division: 0
Unit division: 0

## 2020-06-19 ENCOUNTER — Inpatient Hospital Stay: Payer: Medicaid Other | Attending: Oncology | Admitting: Oncology

## 2020-06-19 ENCOUNTER — Inpatient Hospital Stay: Payer: Medicaid Other

## 2020-06-19 ENCOUNTER — Other Ambulatory Visit: Payer: Self-pay

## 2020-06-19 VITALS — BP 131/91 | HR 96 | Temp 97.9°F | Resp 18 | Ht 64.0 in | Wt 185.8 lb

## 2020-06-19 DIAGNOSIS — M3119 Other thrombotic microangiopathy: Secondary | ICD-10-CM

## 2020-06-19 DIAGNOSIS — M311 Thrombotic microangiopathy: Secondary | ICD-10-CM | POA: Diagnosis not present

## 2020-06-19 DIAGNOSIS — Z21 Asymptomatic human immunodeficiency virus [HIV] infection status: Secondary | ICD-10-CM | POA: Diagnosis not present

## 2020-06-19 LAB — CBC WITH DIFFERENTIAL (CANCER CENTER ONLY)
Abs Immature Granulocytes: 0.73 10*3/uL — ABNORMAL HIGH (ref 0.00–0.07)
Basophils Absolute: 0 10*3/uL (ref 0.0–0.1)
Basophils Relative: 0 %
Eosinophils Absolute: 0 10*3/uL (ref 0.0–0.5)
Eosinophils Relative: 0 %
HCT: 29.8 % — ABNORMAL LOW (ref 36.0–46.0)
Hemoglobin: 9.1 g/dL — ABNORMAL LOW (ref 12.0–15.0)
Immature Granulocytes: 7 %
Lymphocytes Relative: 21 %
Lymphs Abs: 2.3 10*3/uL (ref 0.7–4.0)
MCH: 25.3 pg — ABNORMAL LOW (ref 26.0–34.0)
MCHC: 30.5 g/dL (ref 30.0–36.0)
MCV: 82.8 fL (ref 80.0–100.0)
Monocytes Absolute: 0.7 10*3/uL (ref 0.1–1.0)
Monocytes Relative: 6 %
Neutro Abs: 7.2 10*3/uL (ref 1.7–7.7)
Neutrophils Relative %: 66 %
Platelet Count: 324 10*3/uL (ref 150–400)
RBC: 3.6 MIL/uL — ABNORMAL LOW (ref 3.87–5.11)
RDW: 19.4 % — ABNORMAL HIGH (ref 11.5–15.5)
WBC Count: 10.9 10*3/uL — ABNORMAL HIGH (ref 4.0–10.5)
nRBC: 0.2 % (ref 0.0–0.2)

## 2020-06-19 LAB — CMP (CANCER CENTER ONLY)
ALT: 56 U/L — ABNORMAL HIGH (ref 0–44)
AST: 50 U/L — ABNORMAL HIGH (ref 15–41)
Albumin: 3.4 g/dL — ABNORMAL LOW (ref 3.5–5.0)
Alkaline Phosphatase: 63 U/L (ref 38–126)
Anion gap: 6 (ref 5–15)
BUN: 14 mg/dL (ref 6–20)
CO2: 26 mmol/L (ref 22–32)
Calcium: 9 mg/dL (ref 8.9–10.3)
Chloride: 105 mmol/L (ref 98–111)
Creatinine: 0.9 mg/dL (ref 0.44–1.00)
GFR, Est AFR Am: >60 mL/min (ref >60)
GFR, Estimated: >60 mL/min (ref >60)
Glucose, Bld: 108 mg/dL — ABNORMAL HIGH (ref 70–99)
Potassium: 3.9 mmol/L (ref 3.5–5.1)
Sodium: 137 mmol/L (ref 135–145)
Total Bilirubin: 0.7 mg/dL (ref 0.3–1.2)
Total Protein: 6.4 g/dL — ABNORMAL LOW (ref 6.5–8.1)

## 2020-06-19 LAB — LACTATE DEHYDROGENASE: LDH: 285 U/L — ABNORMAL HIGH (ref 98–192)

## 2020-06-19 NOTE — Progress Notes (Signed)
Hematology and Oncology Follow Up Visit  Catherine Martinez [id number] 17-Feb-1986 33 y.o. 06/19/2020 11:41 AM Patient, No Pcp PerNo ref. provider found   Principle Diagnosis: 34 year old woman with TTP diagnosed on 06/12/2020.  She presented with platelet count of 6, LDH of 1400 and elevated bilirubin.   Prior Therapy:   She is status post therapeutic plasma exchange started on 06/12/2020 completed on 06/17/2020 after achieving a complete response.  Prednisone 60 mg daily started on 06/12/2020.  Current therapy: Prednisone 40 mg daily  Interim History: Catherine Martinez returns today for a follow-up visit.  She was discharged from the hospital on August 28 after completing therapeutic plasma exchange and achieving a complete response to therapy.  Since her discharge she feels well without any complaints at this time.  She denies any neck pain or discomfort.  She denies excessive fatigue, tiredness or weakness.  Denies any easy bruising or bleeding issues.     Medications: I have reviewed the patient's current medications.  Current Outpatient Medications  Medication Sig Dispense Refill  . dapsone 100 MG tablet Take 1 tablet (100 mg total) by mouth daily. 90 tablet 0  . dolutegravir (TIVICAY) 50 MG tablet Take 1 tablet (50 mg total) by mouth daily. 30 tablet 0  . emtricitabine-tenofovir AF (DESCOVY) 200-25 MG tablet Take 1 tablet by mouth daily. 30 tablet 0  . metroNIDAZOLE (FLAGYL) 500 MG tablet Take 1 tablet (500 mg total) by mouth every 12 (twelve) hours for 2 days. 4 tablet 0  . predniSONE (DELTASONE) 20 MG tablet Take 40 mg or2 tablets every morning with breakfast for 7 days,then take 20 mg or 1 tablet every morning with breakfast for 7 days,then stop. 21 tablet 0   No current facility-administered medications for this visit.     Allergies:  Allergies  Allergen Reactions  . Sulfamethoxazole-Trimethoprim Hives     Physical Exam: Blood pressure (!) 131/91, pulse 96, temperature  97.9 F (36.6 C), temperature source Tympanic, resp. rate 18, height 5\' 4"  (1.626 m), weight 185 lb 12.8 oz (84.3 kg), last menstrual period 06/09/2020, SpO2 100 %. ECOG: 0   General appearance: Comfortable appearing without any discomfort Head: Normocephalic without any trauma Oropharynx: Mucous membranes are moist and pink without any thrush or ulcers. Eyes: Pupils are equal and round reactive to light. Lymph nodes: No cervical, supraclavicular, inguinal or axillary lymphadenopathy.   Heart:regular rate and rhythm.  S1 and S2 without leg edema. Lung: Clear without any rhonchi or wheezes.  No dullness to percussion. Abdomin: Soft, nontender, nondistended with good bowel sounds.  No hepatosplenomegaly. Musculoskeletal: No joint deformity or effusion.  Full range of motion noted. Neurological: No deficits noted on motor, sensory and deep tendon reflex exam. Skin: No petechial rash or dryness.  Appeared moist.     Lab Results: Lab Results  Component Value Date   WBC 10.9 (H) 06/19/2020   HGB 9.1 (L) 06/19/2020   HCT 29.8 (L) 06/19/2020   MCV 82.8 06/19/2020   PLT 324 06/19/2020     Chemistry      Component Value Date/Time   NA 137 06/19/2020 0958   K 3.9 06/19/2020 0958   CL 105 06/19/2020 0958   CO2 26 06/19/2020 0958   BUN 14 06/19/2020 0958   CREATININE 0.90 06/19/2020 0958   CREATININE 0.57 01/19/2013 1017      Component Value Date/Time   CALCIUM 9.0 06/19/2020 0958   ALKPHOS 63 06/19/2020 0958   AST 50 (H) 06/19/2020 0958   ALT  56 (H) 06/19/2020 0958   BILITOT 0.7 06/19/2020 0958        Impression and Plan:  33 year old woman with:  1.  TTP presented with severe thrombocytopenia platelet count of 6 and LDH of 1400 as well as elevated bilirubin.  She achieved a complete response with therapeutic plasma exchange and received 3 total days of plasma exchange utilizing 1 plasma volume after achieving a complete response.  Laboratory data reviewed today and showed  complete normalization of her platelet count of 324.  Her hemoglobin continues to improve without any need for transfusion.  The natural course of this disease and future treatment options were reiterated.  I recommended continuing of the prednisone taper for the next 2 weeks and to decrease by 20 mg a week till she is off.  She develops relapsed disease then she will require retreatment with daily plasma exchange and will add rituximab to the regimen.  For the time being we will continue to monitor labs closely.  2.  HIV: No follow-up outpatient in the next few weeks.  She is currently on descovy and tivicay daily.  3.  Follow-up: We will continue to follow labs regularly and she will have MD follow-up next month.  30  minutes were dedicated to this visit. The time was spent on reviewing laboratory data, discussing treatment options, discussing differential diagnosis and answering questions regarding future plan.    Eli Hose, MD 8/30/202111:41 AM

## 2020-06-20 ENCOUNTER — Telehealth: Payer: Self-pay | Admitting: Oncology

## 2020-06-20 LAB — HIV GENOSURE REFLEX - HIVGTY - ELECTRONIC RECORD

## 2020-06-20 LAB — GENOSURE INTEGRASE HIV EDI: HIV Genosure Integrase PDF 2: 1

## 2020-06-20 LAB — ADAMTS13 ACTIVITY: Adamts 13 Activity: <2 % — CL (ref >66.8)

## 2020-06-20 LAB — ADAMTS13 ANTIBODY: ADAMTS13 Antibody: 39 Units/mL — ABNORMAL HIGH (ref <12)

## 2020-06-20 NOTE — Telephone Encounter (Signed)
Scheduled per 08/30 los, patient has been called and notified. 

## 2020-06-27 ENCOUNTER — Inpatient Hospital Stay: Payer: Self-pay | Attending: Oncology

## 2020-06-27 ENCOUNTER — Other Ambulatory Visit: Payer: Self-pay

## 2020-06-27 DIAGNOSIS — M3119 Other thrombotic microangiopathy: Secondary | ICD-10-CM

## 2020-06-27 DIAGNOSIS — M311 Thrombotic microangiopathy: Secondary | ICD-10-CM | POA: Insufficient documentation

## 2020-06-27 LAB — CBC WITH DIFFERENTIAL (CANCER CENTER ONLY)
Abs Immature Granulocytes: 0.19 10*3/uL — ABNORMAL HIGH (ref 0.00–0.07)
Basophils Absolute: 0 10*3/uL (ref 0.0–0.1)
Basophils Relative: 0 %
Eosinophils Absolute: 0.1 10*3/uL (ref 0.0–0.5)
Eosinophils Relative: 1 %
HCT: 31.9 % — ABNORMAL LOW (ref 36.0–46.0)
Hemoglobin: 9.8 g/dL — ABNORMAL LOW (ref 12.0–15.0)
Immature Granulocytes: 3 %
Lymphocytes Relative: 33 %
Lymphs Abs: 2.4 10*3/uL (ref 0.7–4.0)
MCH: 25.5 pg — ABNORMAL LOW (ref 26.0–34.0)
MCHC: 30.7 g/dL (ref 30.0–36.0)
MCV: 83.1 fL (ref 80.0–100.0)
Monocytes Absolute: 0.6 10*3/uL (ref 0.1–1.0)
Monocytes Relative: 8 %
Neutro Abs: 4.1 10*3/uL (ref 1.7–7.7)
Neutrophils Relative %: 55 %
Platelet Count: 268 10*3/uL (ref 150–400)
RBC: 3.84 MIL/uL — ABNORMAL LOW (ref 3.87–5.11)
RDW: 17.8 % — ABNORMAL HIGH (ref 11.5–15.5)
WBC Count: 7.4 10*3/uL (ref 4.0–10.5)
nRBC: 0 % (ref 0.0–0.2)

## 2020-06-27 LAB — CMP (CANCER CENTER ONLY)
ALT: 42 U/L (ref 0–44)
AST: 33 U/L (ref 15–41)
Albumin: 3.4 g/dL — ABNORMAL LOW (ref 3.5–5.0)
Alkaline Phosphatase: 61 U/L (ref 38–126)
Anion gap: 7 (ref 5–15)
BUN: 10 mg/dL (ref 6–20)
CO2: 27 mmol/L (ref 22–32)
Calcium: 9 mg/dL (ref 8.9–10.3)
Chloride: 103 mmol/L (ref 98–111)
Creatinine: 0.81 mg/dL (ref 0.44–1.00)
GFR, Est AFR Am: >60 mL/min (ref >60)
GFR, Estimated: >60 mL/min (ref >60)
Glucose, Bld: 99 mg/dL (ref 70–99)
Potassium: 3.7 mmol/L (ref 3.5–5.1)
Sodium: 137 mmol/L (ref 135–145)
Total Bilirubin: 0.9 mg/dL (ref 0.3–1.2)
Total Protein: 7.1 g/dL (ref 6.5–8.1)

## 2020-06-27 LAB — LACTATE DEHYDROGENASE: LDH: 249 U/L — ABNORMAL HIGH (ref 98–192)

## 2020-07-03 ENCOUNTER — Ambulatory Visit: Payer: Self-pay

## 2020-07-03 ENCOUNTER — Other Ambulatory Visit: Payer: Self-pay

## 2020-07-03 ENCOUNTER — Encounter: Payer: Self-pay | Admitting: Family

## 2020-07-03 ENCOUNTER — Ambulatory Visit (INDEPENDENT_AMBULATORY_CARE_PROVIDER_SITE_OTHER): Payer: Self-pay | Admitting: Family

## 2020-07-03 ENCOUNTER — Telehealth: Payer: Self-pay | Admitting: Pharmacy Technician

## 2020-07-03 VITALS — BP 126/87 | HR 99 | Temp 98.3°F | Wt 187.0 lb

## 2020-07-03 DIAGNOSIS — B20 Human immunodeficiency virus [HIV] disease: Secondary | ICD-10-CM

## 2020-07-03 DIAGNOSIS — Z0000 Encounter for general adult medical examination without abnormal findings: Secondary | ICD-10-CM

## 2020-07-03 DIAGNOSIS — M3119 Other thrombotic microangiopathy: Secondary | ICD-10-CM

## 2020-07-03 DIAGNOSIS — M311 Thrombotic microangiopathy: Secondary | ICD-10-CM

## 2020-07-03 MED ORDER — DAPSONE 100 MG PO TABS: 100.0000 mg | ORAL_TABLET | Freq: Every day | ORAL | 5 refills | Status: DC

## 2020-07-03 MED ORDER — DESCOVY 200-25 MG PO TABS: 1.0000 | ORAL_TABLET | Freq: Every day | ORAL | 5 refills | Status: DC

## 2020-07-03 MED ORDER — TIVICAY 50 MG PO TABS: 50.0000 mg | ORAL_TABLET | Freq: Every day | ORAL | 5 refills | Status: DC

## 2020-07-03 NOTE — Assessment & Plan Note (Signed)
Stable and finishing previous dose of prednisone.  Continue management per oncology.

## 2020-07-03 NOTE — Progress Notes (Signed)
Subjective:    Patient ID: Catherine Martinez, female    DOB: 30-Jan-1986, 34 y.o.   MRN: [id number]  Chief Complaint  Patient presents with  . Follow-up    B20    HPI:  Catherine Martinez is a 34 y.o. female with previous medical history of anemia and HIV disease who was recently admitted to the hospital for thrombocytopenia, T bilirubin of 7.3 with direct of 6 and LDH of 1410.  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  Here today to reestablish care and for hospital follow-up.  Catherine Martinez was previously maintained on Triumeq and noted to have a CD4 count of 209 with CD4 percentage of 10%.  Viral load was determined to be 16,200.  Genotype with no INSTI resistance.  Catherine Martinez was discharged on Tivicay, Descovy, and dapsone.  Since leaving the hospital Catherine Martinez has been doing well and has been seen by oncology and will continue to follow-up.  Catherine Martinez remains on prednisone at this time.  Continues to take Catherine Martinez Tivicay, Descovy, and dapsone daily as prescribed.  Has approximately 14 days worth of medication remaining.  Completed financial assistance today.  Overall feeling well with no new concerns/complaints. Denies fevers, chills, night sweats, headaches, changes in vision, neck pain/stiffness, nausea, diarrhea, vomiting, lesions or rashes.  Catherine Martinez has applied for financial assistance which will take a couple weeks as well as [employer] assistance.  Denies feelings of being down, depressed, or hopeless recently.  No recreational or illicit drug use, tobacco use, or alcohol consumption.  Declines condoms.  Considering Covid vaccination and influenza vaccination.  Due for routine dental care.   Allergies  Allergen Reactions  . Sulfamethoxazole-Trimethoprim Hives      Outpatient Medications Prior to Visit  Medication Sig Dispense Refill  . predniSONE (DELTASONE) 20 MG tablet Take 40 mg or2 tablets every morning with breakfast for 7 days,then take 20 mg or 1  tablet every morning with breakfast for 7 days,then stop. 21 tablet 0  . dapsone 100 MG tablet Take 1 tablet (100 mg total) by mouth daily. 90 tablet 0  . dolutegravir (TIVICAY) 50 MG tablet Take 1 tablet (50 mg total) by mouth daily. 30 tablet 0  . emtricitabine-tenofovir AF (DESCOVY) 200-25 MG tablet Take 1 tablet by mouth daily. 30 tablet 0   No facility-administered medications prior to visit.     Past Medical History:  Diagnosis Date  . Anemia    pregnant  . History of stillbirth   . HIV (human immunodeficiency virus infection) ([place])   . Kidney infection    Recurrent hx  . Kidney infection   . SVD (spontaneous vaginal delivery)    x 4      Past Surgical History:  Procedure Laterality Date  . CESAREAN SECTION WITH BILATERAL TUBAL LIGATION N/A 12/28/2012   Procedure: CESAREAN SECTION WITH BILATERAL TUBAL LIGATION;  Surgeon: Emily Filbert, MD;  Location: [facility];  Service: Obstetrics;  Laterality: N/A;  AZT prior to section  . IR FLUORO GUIDE CV LINE RIGHT  06/12/2020  . IR US GUIDE VASC ACCESS RIGHT  06/12/2020  . TOOTH EXTRACTION N/A 08/25/2015   Procedure: MULTIPLE EXTRACTIONS TEETH # 1, 2, 3, 4, 5, 6, 7, 8, 9, 10, 11, 12, 13, 14, 15, 16, 17, 18, 31, 32 AND ALVEOLOPLASTY;  Surgeon: Diona Browner, DDS;  Location: [facility];  Service: Oral Surgery;  Laterality: N/A;      Family History  Problem Relation Age of Onset  . Diabetes Mother   . Diabetes Maternal Aunt   . Diabetes Maternal Grandfather   . Hyperlipidemia Maternal Grandfather   . Other Neg Hx       Social History   Socioeconomic History  . Marital status: Widowed    Spouse name: Not on file  . Number of children: Not on file  . Years of education: Not on file  . Highest education level: Not on file  Occupational History  . Not on file  Tobacco Use  . Smoking status: Former Smoker    Packs/day: 1.00    Years: 1.00    Pack years: 1.00    Types: Cigarettes    Quit date: 10/21/2009    Years since quitting:  10.7  . Smokeless tobacco: Never Used  Vaping Use  . Vaping Use: Never used  Substance and Sexual Activity  . Alcohol use: No  . Drug use: No  . Sexual activity: Never    Birth control/protection: Surgical    Comment: pregnant  Other Topics Concern  . Not on file  Social History Narrative  . Not on file   Social Determinants of Health   Financial Resource Strain:   . Difficulty of Paying Living Expenses: Not on file  Food Insecurity:   . Worried About Charity fundraiser in the Last Year: Not on file  . Ran Out of Food in the Last Year: Not on file  Transportation Needs:   . Lack of Transportation (Medical): Not on file  . Lack of Transportation (Non-Medical): Not on file  Physical Activity:   . Days of Exercise per Week: Not on file  . Minutes of Exercise per Session: Not on file  Stress:   . Feeling of Stress : Not on file  Social Connections:   . Frequency of Communication with Friends and Family: Not on file  . Frequency of Social Gatherings with Friends and Family: Not on file  . Attends Religious Services: Not on file  . Active Member of Clubs or Organizations: Not on file  . Attends Archivist Meetings: Not on file  . Marital Status: Not on file  Intimate Partner Violence:   . Fear of Current or Ex-Partner: Not on file  . Emotionally Abused: Not on file  . Physically Abused: Not on file  . Sexually Abused: Not on file      Review of Systems  Constitutional: Negative for appetite change, chills, diaphoresis, fatigue, fever and unexpected weight change.  Eyes:       Negative for acute change in vision  Respiratory: Negative for chest tightness, shortness of breath and wheezing.   Cardiovascular: Negative for chest pain.  Gastrointestinal: Negative for diarrhea, nausea and vomiting.  Genitourinary: Negative for dysuria, pelvic pain and vaginal discharge.  Musculoskeletal: Negative for neck pain and neck stiffness.  Skin: Negative for rash.    Neurological: Negative for seizures, syncope, weakness and headaches.  Hematological: Negative for adenopathy. Does not bruise/bleed easily.  Psychiatric/Behavioral: Negative for hallucinations.       Objective:    BP 126/87   Pulse 99   Temp 98.3 F (36.8 C) (Oral)   Wt 187 lb (84.8 kg)   LMP 06/09/2020   BMI 32.10 kg/m  Nursing note and vital signs reviewed.  Physical Exam Constitutional:      General: Catherine Martinez is not in acute distress.    Appearance: Catherine Martinez is well-developed.  Eyes:     Conjunctiva/sclera: Conjunctivae  normal.  Cardiovascular:     Rate and Rhythm: Normal rate and regular rhythm.     Heart sounds: Normal heart sounds. No murmur heard.  No friction rub. No gallop.   Pulmonary:     Effort: Pulmonary effort is normal. No respiratory distress.     Breath sounds: Normal breath sounds. No wheezing or rales.  Chest:     Chest wall: No tenderness.  Abdominal:     General: Bowel sounds are normal.     Palpations: Abdomen is soft.     Tenderness: There is no abdominal tenderness.  Musculoskeletal:     Cervical back: Neck supple.  Lymphadenopathy:     Cervical: No cervical adenopathy.  Skin:    General: Skin is warm and dry.     Findings: No rash.  Neurological:     Mental Status: Catherine Martinez is alert and oriented to person, place, and time.  Psychiatric:        Behavior: Behavior normal.        Thought Content: Thought content normal.        Judgment: Judgment normal.         Assessment & Plan:   Patient Active Problem List   Diagnosis Date Noted  . Healthcare maintenance 07/03/2020  . 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 Transaminitis 06/12/2020  . Acute lower UTI 06/12/2020  . Acute kidney injury superimposed on CKD ([employer]) 06/12/2020  . Microcytic hypochromic anemia 06/12/2020  . Hyperbilirubinemia 06/12/2020  . Late prenatal care complicating pregnancy 63/33/5456  . Previous stillbirth or demise, antepartum 12/14/2012  .  Trichomonas infection 11/18/2012  . CHLAMYDIAL INFECTION, HX OF 04/12/2010  . HIV INFECTION 03/29/2010     Problem List Items Addressed This Visit      Cardiovascular and Mediastinum   TTP (thrombotic thrombocytopenic purpura) (H[facility])    Stable and finishing previous dose of prednisone.  Continue management per oncology.        Other   HIV INFECTION    Catherine Martinez appears to be doing well with Catherine Martinez current ART regimen of Tivicay, Descovy, and dapsone.  Reviewed previous lab work and discussed plan of care.  No signs/symptoms of opportunistic infection or progressive HIV disease.  Catherine Martinez met with pharmacy staff today for assistance with medication as well as to sign up for financial assistance with UMAP/ADAP.  Continue current dose of Tivicay, Descovy, and dapsone.  Plan for follow-up in 1 month or sooner if needed with lab work on the same day.      Relevant Medications   emtricitabine-tenofovir AF (DESCOVY) 200-25 MG tablet   dolutegravir (TIVICAY) 50 MG tablet   dapsone 100 MG tablet   Healthcare maintenance     Discussed/recommended Covid vaccine which Catherine Martinez is considering at the completion of Catherine Martinez prednisone treatment.  Declines influenza vaccine today.  Discussed importance of safe sexual practice to reduce risk of STI.  Condoms declined.  Referral placed to [place] [facility] for routine dental care.            I am having Kayani D. Buysse maintain Catherine Martinez predniSONE, Descovy, Tivicay, and dapsone.   Meds ordered this encounter  Medications  . emtricitabine-tenofovir AF (DESCOVY) 200-25 MG tablet    Sig: Take 1 tablet by mouth daily.    Dispense:  30 tablet    Refill:  5    BIN#: [id number] PCN: [id number] GROUP: [id number] MEMBER#:[id number]    Order Specific Question:   Supervising Provider    Answer:  Thayer Headings [[id number]]  . dolutegravir (TIVICAY) 50 MG tablet    Sig: Take 1 tablet (50 mg total) by mouth daily.    Dispense:  30 tablet    Refill:  5    VF:[id number]  UKR:[id number] CRF:VOHK Group: [id number]    Order Specific Question:   Supervising Provider    Answer:   Thayer Headings [[id number]]  . dapsone 100 MG tablet    Sig: Take 1 tablet (100 mg total) by mouth daily.    Dispense:  30 tablet    Refill:  5    Order Specific Question:   Supervising Provider    Answer:   Thayer Headings [[id number]]     Follow-up: Return in about 1 month (around 08/02/2020).    Terri Piedra, MSN, FNP-C Nurse Practitioner [facility] [place] number: [phone]

## 2020-07-03 NOTE — Assessment & Plan Note (Signed)
Catherine Martinez appears to be doing well with her current ART regimen of Tivicay, Descovy, and dapsone.  Reviewed previous lab work and discussed plan of care.  No signs/symptoms of opportunistic infection or progressive HIV disease.  She met with pharmacy staff today for assistance with medication as well as to sign up for financial assistance with UMAP/ADAP.  Continue current dose of Tivicay, Descovy, and dapsone.  Plan for follow-up in 1 month or sooner if needed with lab work on the same day.

## 2020-07-03 NOTE — Telephone Encounter (Addendum)
[facility] Patient Advocate Encounter  Someone previously completed and sent [employer] Advancing Access application for Descovy for this patient who is uninsured.    Patient is approved 06/15/2020 through 06/15/2021.  BIN      [id number] PCN    [id number] GRP    [id number] ID        [id number]   Someone previously completed and sent [employer] application for Tivicay for this patient who is uninsured.  Patient is approved for three immediate fills.  The first fill was in August of 2021.  BIN      [id number] PCN    [id number] GRP    [id number] ID        [id number]  This will bridge until ADAP approved.   Netty Starring. Dimas Aguas CPhT Specialty Pharmacy Patient [facility] Phone: [phone] Fax:  [phone]

## 2020-07-03 NOTE — Patient Instructions (Signed)
Nice to see you.  We will continue your current dose of Tivicay, Descovy and Dapsone.  We will get you scheduled for the dentist.   Plan for follow up in 1 month or sooner if needed with lab work on the same day.   Have a great day and stay safe!

## 2020-07-03 NOTE — Assessment & Plan Note (Signed)
   Discussed/recommended Covid vaccine which she is considering at the completion of her prednisone treatment.  Declines influenza vaccine today.  Discussed importance of safe sexual practice to reduce risk of STI.  Condoms declined.  Referral placed to [facility] dental clinic for routine dental care.

## 2020-07-18 ENCOUNTER — Inpatient Hospital Stay: Payer: Medicaid Other | Admitting: Family Medicine

## 2020-07-24 ENCOUNTER — Other Ambulatory Visit: Payer: Medicaid Other

## 2020-07-24 ENCOUNTER — Ambulatory Visit: Payer: Medicaid Other | Admitting: Oncology

## 2020-07-25 ENCOUNTER — Telehealth: Payer: Self-pay | Admitting: Oncology

## 2020-07-25 NOTE — Telephone Encounter (Signed)
R/s appt per 10/4 sch msg - unable to reach pt , left message with appt date and time

## 2020-08-03 ENCOUNTER — Ambulatory Visit: Payer: Self-pay | Admitting: Family

## 2020-08-18 ENCOUNTER — Inpatient Hospital Stay (HOSPITAL_BASED_OUTPATIENT_CLINIC_OR_DEPARTMENT_OTHER): Payer: Self-pay | Admitting: Oncology

## 2020-08-18 ENCOUNTER — Other Ambulatory Visit: Payer: Self-pay

## 2020-08-18 ENCOUNTER — Inpatient Hospital Stay: Payer: Self-pay | Attending: Oncology

## 2020-08-18 VITALS — BP 122/88 | HR 87 | Temp 98.5°F | Resp 18 | Ht 64.0 in | Wt 188.6 lb

## 2020-08-18 DIAGNOSIS — Z21 Asymptomatic human immunodeficiency virus [HIV] infection status: Secondary | ICD-10-CM | POA: Insufficient documentation

## 2020-08-18 DIAGNOSIS — M3119 Other thrombotic microangiopathy: Secondary | ICD-10-CM | POA: Insufficient documentation

## 2020-08-18 LAB — CMP (CANCER CENTER ONLY)
ALT: 20 U/L (ref 0–44)
AST: 23 U/L (ref 15–41)
Albumin: 4 g/dL (ref 3.5–5.0)
Alkaline Phosphatase: 63 U/L (ref 38–126)
Anion gap: 7 (ref 5–15)
BUN: 10 mg/dL (ref 6–20)
CO2: 26 mmol/L (ref 22–32)
Calcium: 9.6 mg/dL (ref 8.9–10.3)
Chloride: 106 mmol/L (ref 98–111)
Creatinine: 0.79 mg/dL (ref 0.44–1.00)
GFR, Estimated: >60 mL/min (ref >60)
Glucose, Bld: 95 mg/dL (ref 70–99)
Potassium: 4 mmol/L (ref 3.5–5.1)
Sodium: 139 mmol/L (ref 135–145)
Total Bilirubin: 0.5 mg/dL (ref 0.3–1.2)
Total Protein: 8.3 g/dL — ABNORMAL HIGH (ref 6.5–8.1)

## 2020-08-18 LAB — CBC WITH DIFFERENTIAL (CANCER CENTER ONLY)
Abs Immature Granulocytes: 0.03 10*3/uL (ref 0.00–0.07)
Basophils Absolute: 0 10*3/uL (ref 0.0–0.1)
Basophils Relative: 1 %
Eosinophils Absolute: 0 10*3/uL (ref 0.0–0.5)
Eosinophils Relative: 1 %
HCT: 38.1 % (ref 36.0–46.0)
Hemoglobin: 11.9 g/dL — ABNORMAL LOW (ref 12.0–15.0)
Immature Granulocytes: 1 %
Lymphocytes Relative: 58 %
Lymphs Abs: 2.3 10*3/uL (ref 0.7–4.0)
MCH: 24.5 pg — ABNORMAL LOW (ref 26.0–34.0)
MCHC: 31.2 g/dL (ref 30.0–36.0)
MCV: 78.4 fL — ABNORMAL LOW (ref 80.0–100.0)
Monocytes Absolute: 0.5 10*3/uL (ref 0.1–1.0)
Monocytes Relative: 11 %
Neutro Abs: 1.1 10*3/uL — ABNORMAL LOW (ref 1.7–7.7)
Neutrophils Relative %: 28 %
Platelet Count: 261 10*3/uL (ref 150–400)
RBC: 4.86 MIL/uL (ref 3.87–5.11)
RDW: 12.6 % (ref 11.5–15.5)
WBC Count: 4 10*3/uL (ref 4.0–10.5)
nRBC: 0 % (ref 0.0–0.2)

## 2020-08-18 LAB — LACTATE DEHYDROGENASE: LDH: 205 U/L — ABNORMAL HIGH (ref 98–192)

## 2020-08-18 NOTE — Progress Notes (Signed)
Hematology and Oncology Follow Up Visit  Catherine Martinez [id number] 1986/06/04 34 y.o. 08/18/2020 3:06 PM Patient, No Pcp PerNo ref. provider found   Principle Diagnosis: 34 year old woman with TTP related to HIV diagnosed on 06/12/2020.  She was found to have low white count of 6000 and elevated LDH.   Prior Therapy:   She is status post therapeutic plasma exchange started on 06/12/2020 completed on 06/17/2020 after achieving a complete response.  Prednisone 60 mg daily started on 06/12/2020.  Therapy tapered off subsequently.  Current therapy: Active surveillance.  Interim History: Catherine Martinez is here for return evaluation.  Since her last visit, she reports no major changes in her health.  He has missed a few appointments because of Covid infection in her household although she tested negative and did not have any symptoms.  She denies any recent complaints at this time.  She denies any excessive fatigue, tiredness or bruising.  She denies any petechiae or bleeding.     Medications: Updated on review. Current Outpatient Medications  Medication Sig Dispense Refill  . dapsone 100 MG tablet Take 1 tablet (100 mg total) by mouth daily. 30 tablet 5  . dolutegravir (TIVICAY) 50 MG tablet Take 1 tablet (50 mg total) by mouth daily. 30 tablet 5  . emtricitabine-tenofovir AF (DESCOVY) 200-25 MG tablet Take 1 tablet by mouth daily. 30 tablet 5  . predniSONE (DELTASONE) 20 MG tablet Take 40 mg or2 tablets every morning with breakfast for 7 days,then take 20 mg or 1 tablet every morning with breakfast for 7 days,then stop. 21 tablet 0   No current facility-administered medications for this visit.     Allergies:  Allergies  Allergen Reactions  . Sulfamethoxazole-Trimethoprim Hives     Physical Exam: Blood pressure 122/88, pulse 87, temperature 98.5 F (36.9 C), temperature source Tympanic, resp. rate 18, height 5\' 4"  (1.626 m), weight 188 lb 9.6 oz (85.5 kg), SpO2 97 %.   ECOG:  0    General appearance: Alert, awake without any distress. Head: Atraumatic without abnormalities Oropharynx: Without any thrush or ulcers. Eyes: No scleral icterus. Lymph nodes: No lymphadenopathy noted in the cervical, supraclavicular, or axillary nodes Heart:regular rate and rhythm, without any murmurs or gallops.   Lung: Clear to auscultation without any rhonchi, wheezes or dullness to percussion. Abdomin: Soft, nontender without any shifting dullness or ascites. Musculoskeletal: No clubbing or cyanosis. Neurological: No motor or sensory deficits. Skin: No rashes or lesions.      Lab Results: Lab Results  Component Value Date   WBC 7.4 06/27/2020   HGB 9.8 (L) 06/27/2020   HCT 31.9 (L) 06/27/2020   MCV 83.1 06/27/2020   PLT 268 06/27/2020     Chemistry      Component Value Date/Time   NA 137 06/27/2020 1039   K 3.7 06/27/2020 1039   CL 103 06/27/2020 1039   CO2 27 06/27/2020 1039   BUN 10 06/27/2020 1039   CREATININE 0.81 06/27/2020 1039   CREATININE 0.57 01/19/2013 1017      Component Value Date/Time   CALCIUM 9.0 06/27/2020 1039   ALKPHOS 61 06/27/2020 1039   AST 33 06/27/2020 1039   ALT 42 06/27/2020 1039   BILITOT 0.9 06/27/2020 1039        Impression and Plan:  34 year old woman with:  1.  Acute TTP diagnosed in August 2021.  She was found to have severe thrombocytopenia with elevated LDH and bilirubin.   He achieved a complete response with normalization of  her platelet count and improvement in her LDH in September 2021.  She is currently on active surveillance at this time.  Laboratory data from today reviewed and showed normal left platelet count and near normalization of hemoglobin.  The natural course of her disease was reviewed and medicine options were discussed.  At this time she does not require any intervention and I recommended continued surveillance.   2.  HIV: She is following with the infectious disease clinic and currently on  antiretroviral therapy.  3.  Follow-up: In 6 months for repeat evaluation.  30  minutes were dedicated to this encounter.  Time was spent on reviewing her disease status, reviewing laboratory data and outlining future plan of care.    Eli Hose, MD 10/29/20213:06 PM

## 2020-08-24 ENCOUNTER — Ambulatory Visit (INDEPENDENT_AMBULATORY_CARE_PROVIDER_SITE_OTHER): Payer: Self-pay | Admitting: Family

## 2020-08-24 ENCOUNTER — Other Ambulatory Visit: Payer: Self-pay

## 2020-08-24 ENCOUNTER — Encounter: Payer: Self-pay | Admitting: Family

## 2020-08-24 VITALS — BP 126/85 | HR 94 | Temp 98.1°F | Ht 64.0 in | Wt 188.0 lb

## 2020-08-24 DIAGNOSIS — Z113 Encounter for screening for infections with a predominantly sexual mode of transmission: Secondary | ICD-10-CM

## 2020-08-24 DIAGNOSIS — Z79899 Other long term (current) drug therapy: Secondary | ICD-10-CM

## 2020-08-24 DIAGNOSIS — Z23 Encounter for immunization: Secondary | ICD-10-CM

## 2020-08-24 DIAGNOSIS — B20 Human immunodeficiency virus [HIV] disease: Secondary | ICD-10-CM

## 2020-08-24 DIAGNOSIS — Z0000 Encounter for general adult medical examination without abnormal findings: Secondary | ICD-10-CM

## 2020-08-24 NOTE — Patient Instructions (Addendum)
Nice to see you.  We will check your blood work today.  Work on the Industrial/product designer with Roe Coombs.  We will get you sample of Biktarvy.   Plan for follow up in 1 month or sooner if needed.   Let us know if you need more samples.  Have a great day and stay safe!

## 2020-08-24 NOTE — Assessment & Plan Note (Addendum)
Ms. Wamser reports having good adherence and tolerance to her ART regimen of Tivicay and Descovy however has been off Dapsone secondary to lack of insurance coverage. We reviewed lab work and discussed importance of taking medications daily as prescribed. No signs/symptoms of opportunistic infection or progressive HIV disease. Check lab work today. Will meet with financial counselor to complete necessary paperwork. Samples of Biktarvy provided and logged in pharmacy records. Plan for follow up in 1 month or sooner if needed.

## 2020-08-24 NOTE — Assessment & Plan Note (Signed)
   Prevnar updated today.  Discussed importance of safe sexual practice to reduce risk of STI.  Condoms declined  Routine dental care is due and will refer to [facility] dental clinic.

## 2020-08-24 NOTE — Progress Notes (Signed)
Subjective:    Patient ID: Catherine Martinez, female    DOB: 1986-10-04, 34 y.o.   MRN: [id number]  Chief Complaint  Patient presents with   Follow-up    declined condoms      HPI:  Catherine Martinez is a 34 y.o. female with HIV disease who was last seen in the office on 07/03/2020 with good adherence and tolerance to her ART regimen Tivicay and Descovy supplement with dapsone for OI prophylaxis.  Most recent blood work on 8/23 showed a CD4 count of 209 with CD4 percentage of 10 and a viral load of 16,200.  Here today for routine follow-up.  Catherine Martinez has been taking her Tivicay and Descovy daily as prescribed with no adverse side effects.  She has been out of medication for 3 days as she has been unable to obtain a refill as she is currently awaiting approval for a DAP.  No signs/symptoms of opportunistic infection or progressive HIV disease.  Children were recently diagnosed with COVID-19 which she continues to test negative for.  She has not been able to afford the dapsone at this time.    Catherine Martinez generally has no problems obtaining medication from the pharmacy, however her financial assistance remains pending and she has no primary insurance.  She will meet with financial counselor today.  Denies any feelings of being down, depressed, or hopeless recently.  No recreational or illicit drug use, tobacco use, and alcohol consumption is occasional/social.  Declines condoms.  Due for routine dental care.     Allergies  Allergen Reactions   Sulfamethoxazole-Trimethoprim Hives      Outpatient Medications Prior to Visit  Medication Sig Dispense Refill   dolutegravir (TIVICAY) 50 MG tablet Take 1 tablet (50 mg total) by mouth daily. 30 tablet 5   emtricitabine-tenofovir AF (DESCOVY) 200-25 MG tablet Take 1 tablet by mouth daily. 30 tablet 5   dapsone 100 MG tablet Take 1 tablet (100 mg total) by mouth daily. (Patient not taking: Reported on 08/24/2020) 30 tablet 5    predniSONE (DELTASONE) 20 MG tablet Take 40 mg or2 tablets every morning with breakfast for 7 days,then take 20 mg or 1 tablet every morning with breakfast for 7 days,then stop. 21 tablet 0   No facility-administered medications prior to visit.     Past Medical History:  Diagnosis Date   Anemia    pregnant   History of stillbirth    HIV (human immunodeficiency virus infection) (HCC)    Kidney infection    Recurrent hx   Kidney infection    SVD (spontaneous vaginal delivery)    x 4     Past Surgical History:  Procedure Laterality Date   CESAREAN SECTION WITH BILATERAL TUBAL LIGATION N/A 12/28/2012   Procedure: CESAREAN SECTION WITH BILATERAL TUBAL LIGATION;  Surgeon: [name], MD;  Location: [facility];  Service: Obstetrics;  Laterality: N/A;  AZT prior to section   IR FLUORO GUIDE CV LINE RIGHT  06/12/2020   IR US GUIDE VASC ACCESS RIGHT  06/12/2020   TOOTH EXTRACTION N/A 08/25/2015   Procedure: MULTIPLE EXTRACTIONS TEETH # 1, 2, 3, 4, 5, 6, 7, 8, 9, 10, 11, 12, 13, 14, 15, 16, 17, 18, 31, 32 AND ALVEOLOPLASTY;  Surgeon: [name], DDS;  Location: [facility];  Service: Oral Surgery;  Laterality: N/A;       Review of Systems  Constitutional: Negative for chills, diaphoresis, fatigue and fever.  Respiratory: Negative for cough, chest tightness, shortness  of breath and wheezing.   Cardiovascular: Negative for chest pain.  Gastrointestinal: Negative for abdominal pain, diarrhea, nausea and vomiting.      Objective:    BP 126/85    Pulse 94    Temp 98.1 F (36.7 C) (Oral)    Ht 5\' 4"  (1.626 m)    Wt 188 lb (85.3 kg)    SpO2 100%    BMI 32.27 kg/m  Nursing note and vital signs reviewed.  Physical Exam Constitutional:      General: She is not in acute distress.    Appearance: She is well-developed.  Eyes:     Conjunctiva/sclera: Conjunctivae normal.  Cardiovascular:     Rate and Rhythm: Normal rate and regular rhythm.     Heart sounds: Normal heart sounds. No murmur  heard.  No friction rub. No gallop.   Pulmonary:     Effort: Pulmonary effort is normal. No respiratory distress.     Breath sounds: Normal breath sounds. No wheezing or rales.  Chest:     Chest wall: No tenderness.  Abdominal:     General: Bowel sounds are normal.     Palpations: Abdomen is soft.     Tenderness: There is no abdominal tenderness.  Musculoskeletal:     Cervical back: Neck supple.  Lymphadenopathy:     Cervical: No cervical adenopathy.  Skin:    General: Skin is warm and dry.     Findings: No rash.  Neurological:     Mental Status: She is alert and oriented to person, place, and time.  Psychiatric:        Behavior: Behavior normal.        Thought Content: Thought content normal.        Judgment: Judgment normal.      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   Transaminitis 06/12/2020   Acute lower UTI 06/12/2020   Acute kidney injury superimposed on CKD (HCC) 06/12/2020   Microcytic hypochromic anemia 06/12/2020   Hyperbilirubinemia 06/12/2020   Late prenatal care complicating pregnancy 12/14/2012   Previous stillbirth or demise, antepartum 12/14/2012   Trichomonas infection 11/18/2012   CHLAMYDIAL INFECTION, HX OF 04/12/2010   HIV INFECTION 03/29/2010     Problem List Items Addressed This Visit      Other   HIV INFECTION - Primary    Catherine Martinez reports having good adherence and tolerance to her ART regimen of Tivicay and Descovy however has been off Dapsone secondary to lack of insurance coverage. We reviewed lab work and discussed importance of taking medications daily as prescribed. No signs/symptoms of opportunistic infection or progressive HIV  disease. Check lab work today. Will meet with financial counselor to complete necessary paperwork. Samples of Biktarvy provided and logged in pharmacy records. Plan for follow up in 1 month or sooner if needed.       Relevant Orders   COMPLETE METABOLIC PANEL WITH GFR   HIV-1 RNA quant-no reflex-bld   T-helper cell (CD4)- ([facility] clinic only)   Pneumococcal conjugate vaccine 13-valent IM (Completed)   Healthcare maintenance     Prevnar updated today.  Discussed importance of safe sexual practice to reduce  risk of STI.  Condoms declined  Routine dental care is due and will refer to [facility] dental clinic.         Other Visit Diagnoses    Screening for STDs (sexually transmitted diseases)       Relevant Orders   RPR   Pharmacologic therapy       Relevant Orders   Lipid panel   Need for pneumococcal vaccination       Relevant Orders   Pneumococcal conjugate vaccine 13-valent IM (Completed)      I have discontinued Margeaux D. Seider's Descovy and Tivicay. I am also having her maintain her predniSONE and dapsone.    Follow-up: Return in about 1 month (around 09/23/2020), or if symptoms worsen or fail to improve.   Marcos Eke, MSN, FNP-C Nurse Practitioner [facility] Main number: [phone]

## 2020-08-25 ENCOUNTER — Encounter: Payer: Self-pay | Admitting: Family

## 2020-08-25 LAB — T-HELPER CELL (CD4) - (RCID CLINIC ONLY)
CD4 % Helper T Cell: 21 % — ABNORMAL LOW (ref 33–65)
CD4 T Cell Abs: 299 /uL — ABNORMAL LOW (ref 400–1790)

## 2020-08-27 LAB — COMPLETE METABOLIC PANEL WITH GFR
AG Ratio: 1.2 (calc) (ref 1.0–2.5)
ALT: 18 U/L (ref 6–29)
AST: 22 U/L (ref 10–30)
Albumin: 4.3 g/dL (ref 3.6–5.1)
Alkaline phosphatase (APISO): 54 U/L (ref 31–125)
BUN: 8 mg/dL (ref 7–25)
CO2: 24 mmol/L (ref 20–32)
Calcium: 9.4 mg/dL (ref 8.6–10.2)
Chloride: 105 mmol/L (ref 98–110)
Creat: 0.62 mg/dL (ref 0.50–1.10)
GFR, Est African American: 134 mL/min/{1.73_m2} (ref >=60)
GFR, Est Non African American: 116 mL/min/{1.73_m2} (ref >=60)
Globulin: 3.7 g/dL (calc) (ref 1.9–3.7)
Glucose, Bld: 85 mg/dL (ref 65–99)
Potassium: 3.9 mmol/L (ref 3.5–5.3)
Sodium: 139 mmol/L (ref 135–146)
Total Bilirubin: 0.4 mg/dL (ref 0.2–1.2)
Total Protein: 8 g/dL (ref 6.1–8.1)

## 2020-08-27 LAB — HIV-1 RNA QUANT-NO REFLEX-BLD
HIV 1 RNA Quant: 5370 Copies/mL — ABNORMAL HIGH
HIV-1 RNA Quant, Log: 3.73 Log cps/mL — ABNORMAL HIGH

## 2020-08-27 LAB — LIPID PANEL
Cholesterol: 209 mg/dL — ABNORMAL HIGH (ref <200)
HDL: 58 mg/dL (ref >=50)
LDL Cholesterol (Calc): 129 mg/dL (calc) — ABNORMAL HIGH
Non-HDL Cholesterol (Calc): 151 mg/dL (calc) — ABNORMAL HIGH (ref <130)
Total CHOL/HDL Ratio: 3.6 (calc) (ref <5.0)
Triglycerides: 114 mg/dL (ref <150)

## 2020-08-27 LAB — RPR: RPR Ser Ql: NONREACTIVE

## 2020-09-27 ENCOUNTER — Ambulatory Visit: Payer: Medicaid Other | Admitting: Family

## 2020-09-28 ENCOUNTER — Ambulatory Visit: Payer: Self-pay | Admitting: Family

## 2020-09-28 ENCOUNTER — Other Ambulatory Visit: Payer: Self-pay

## 2020-09-28 ENCOUNTER — Encounter: Payer: Self-pay | Admitting: Family

## 2020-09-28 VITALS — BP 131/89 | HR 81 | Temp 98.0°F | Wt 190.0 lb

## 2020-09-28 DIAGNOSIS — Z0000 Encounter for general adult medical examination without abnormal findings: Secondary | ICD-10-CM

## 2020-09-28 DIAGNOSIS — B20 Human immunodeficiency virus [HIV] disease: Secondary | ICD-10-CM

## 2020-09-28 MED ORDER — BICTEGRAVIR-EMTRICITAB-TENOFOV 50-200-25 MG PO TABS: 1.0000 | ORAL_TABLET | Freq: Every day | ORAL | 3 refills | Status: DC

## 2020-09-28 NOTE — Patient Instructions (Addendum)
Nice to see you.  We will continue your Catherine Martinez  We will check your lab work today.   Please call [facility]) to schedule/follow up on your dental care at [phone]  Schedule an appointment with Catherine Alberts, NP for Pap smear;  Plan for follow up in 6 weeks with lab work on 1-2 weeks prior to your appointment.   Have a great day and stay safe!

## 2020-09-28 NOTE — Assessment & Plan Note (Addendum)
Ms. Gilkey appears to have good adherence and tolerance to her ART regimen of Biktarvy over the last month.  No signs/symptoms of opportunistic infection or progressive HIV disease.  Discussed switching from Tivicay/Descovy to [facility].  Check lab work today.  Change Tivicay/Descovy to [employer].  Plan for follow-up in 6 weeks or sooner if needed.  We will renew financial assistance at that time.

## 2020-09-28 NOTE — Progress Notes (Signed)
Subjective:    Patient ID: Catherine Martinez, female    DOB: [birth year], 34 y.o.   MRN: [id number]  No chief complaint on file.    HPI:  Catherine Martinez is a 34 y.o. female with HIV disease who was last seen on 08/24/20 having run out of medication secondary to lapse in financial assistance. Lab work showed a CD4 count of 299 with viral load of 5,370 which was improved rom previous 16,200 on 8/23. Given a 1 month supply of Biktarvy while awaiting renewal of financial assistance. Here today for routine follow up.   Catherine Martinez has been taking her medication daily as prescribed with no adverse side effects or missed doses since her last office visit.  Overall feeling well today with no new concerns/complaints. Denies fevers, chills, night sweats, headaches, changes in vision, neck pain/stiffness, nausea, diarrhea, vomiting, lesions or rashes.  Catherine Martinez has no problems obtaining medications from the pharmacy now that her financial assistance has been approved.  Denies feelings of being down, depressed, or hopeless recently.  No recreational or illicit drug use, tobacco use, or alcohol consumption.  Declines condoms.  Declines influenza vaccine today.  Considering Covid vaccination.   Allergies  Allergen Reactions  . Sulfamethoxazole-Trimethoprim Hives      Outpatient Medications Prior to Visit  Medication Sig Dispense Refill  . dapsone 100 MG tablet Take 1 tablet (100 mg total) by mouth daily. (Patient not taking: No sig reported) 30 tablet 5  . predniSONE (DELTASONE) 20 MG tablet Take 40 mg or2 tablets every morning with breakfast for 7 days,then take 20 mg or 1 tablet every morning with breakfast for 7 days,then stop. (Patient not taking: Reported on 09/28/2020) 21 tablet 0   No facility-administered medications prior to visit.     Past Medical History:  Diagnosis Date  . Anemia    pregnant  . History of stillbirth   . HIV (human immunodeficiency virus infection)  (HCC)   . Kidney infection    Recurrent hx  . Kidney infection   . SVD (spontaneous vaginal delivery)    x 4     Past Surgical History:  Procedure Laterality Date  . CESAREAN SECTION WITH BILATERAL TUBAL LIGATION N/A 12/28/2012   Procedure: CESAREAN SECTION WITH BILATERAL TUBAL LIGATION;  Surgeon: [name], MD;  Location: [facility];  Service: Obstetrics;  Laterality: N/A;  AZT prior to section  . IR FLUORO GUIDE CV LINE RIGHT  06/12/2020  . IR US GUIDE VASC ACCESS RIGHT  06/12/2020  . TOOTH EXTRACTION N/A 08/25/2015   Procedure: MULTIPLE EXTRACTIONS TEETH # 1, 2, 3, 4, 5, 6, 7, 8, 9, 10, 11, 12, 13, 14, 15, 16, 17, 18, 31, 32 AND ALVEOLOPLASTY;  Surgeon: [name], DDS;  Location: [facility] OR;  Service: Oral Surgery;  Laterality: N/A;       Review of Systems  Constitutional: Negative for appetite change, chills, diaphoresis, fatigue, fever and unexpected weight change.  Eyes:       Negative for acute change in vision  Respiratory: Negative for chest tightness, shortness of breath and wheezing.   Cardiovascular: Negative for chest pain.  Gastrointestinal: Negative for diarrhea, nausea and vomiting.  Genitourinary: Negative for dysuria, pelvic pain and vaginal discharge.  Musculoskeletal: Negative for neck pain and neck stiffness.  Skin: Negative for rash.  Neurological: Negative for seizures, syncope, weakness and headaches.  Hematological: Negative for adenopathy. Does not bruise/bleed easily.  Psychiatric/Behavioral: Negative for hallucinations.      Objective:  BP 131/89   Pulse 81   Temp 98 F (36.7 C) (Oral)   Wt 190 lb (86.2 kg)   LMP 09/13/2020   BMI 32.61 kg/m  Nursing note and vital signs reviewed.  Physical Exam Constitutional:      General: She is not in acute distress.    Appearance: She is well-developed.  HENT:     Mouth/Throat:     Mouth: Oropharynx is clear and moist.  Eyes:     Conjunctiva/sclera: Conjunctivae normal.  Cardiovascular:     Rate and  Rhythm: Normal rate and regular rhythm.     Pulses: Intact distal pulses.     Heart sounds: Normal heart sounds. No murmur heard. No friction rub. No gallop.   Pulmonary:     Effort: Pulmonary effort is normal. No respiratory distress.     Breath sounds: Normal breath sounds. No wheezing or rales.  Chest:     Chest wall: No tenderness.  Abdominal:     General: Bowel sounds are normal.     Palpations: Abdomen is soft.     Tenderness: There is no abdominal tenderness.  Musculoskeletal:     Cervical back: Neck supple.  Lymphadenopathy:     Cervical: No cervical adenopathy.  Skin:    General: Skin is warm and dry.     Findings: No rash.  Neurological:     Mental Status: She is alert and oriented to person, place, and time.  Psychiatric:        Mood and Affect: Mood and affect normal.        Behavior: Behavior normal.        Thought Content: Thought content normal.        Judgment: Judgment normal.      Depression screen [facility] 2/9 08/24/2020 07/03/2020 02/03/2013 12/15/2012  Decreased Interest 0 0 0 0  Down, Depressed, Hopeless 0 0 0 0  PHQ - 2 Score 0 0 0 0       Assessment & Plan:    Patient Active Problem List   Diagnosis Date Noted  . Healthcare maintenance 07/03/2020  . 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 Transaminitis 06/12/2020  . Acute lower UTI 06/12/2020  . Acute kidney injury superimposed on CKD (HCC) 06/12/2020  . Microcytic hypochromic anemia 06/12/2020  . Hyperbilirubinemia 06/12/2020  . Late prenatal care complicating pregnancy 12/14/2012  . Previous stillbirth or demise, antepartum 12/14/2012  . Trichomonas infection 11/18/2012  . CHLAMYDIAL INFECTION, HX OF 04/12/2010  . HIV INFECTION 03/29/2010     Problem List Items Addressed This Visit      Other   HIV INFECTION - Primary    Catherine Martinez appears to have good adherence and tolerance to her ART regimen of Biktarvy over the last month.  No signs/symptoms of opportunistic infection  or progressive HIV disease.  Discussed switching from Tivicay/Descovy to [facility].  Check lab work today.  Change Tivicay/Descovy to [employer].  Plan for follow-up in 6 weeks or sooner if needed.  We will renew financial assistance at that time.      Relevant Medications   bictegravir-emtricitabine-tenofovir AF (BIKTARVY) 50-200-25 MG TABS tablet   Other Relevant Orders   HIV-1 RNA quant-no reflex-bld   Comprehensive metabolic panel   COMPLETE METABOLIC PANEL WITH GFR   HIV-1 RNA quant-no reflex-bld   Healthcare maintenance     Due for routine dental care with scheduling phone number provided in after visit summary to make appointment.  Due for cervical cancer screening through Pap  smear with information to schedule with Pap clinic or gynecology.  Discussed importance of safe sexual practice to reduce risk of STI.  Condoms declined.  Declines influenza and Covid vaccines today.          I am having Floyce D. Southard start on bictegravir-emtricitabine-tenofovir AF. I am also having her maintain her predniSONE and dapsone.   Meds ordered this encounter  Medications  . bictegravir-emtricitabine-tenofovir AF (BIKTARVY) 50-200-25 MG TABS tablet    Sig: Take 1 tablet by mouth daily.    Dispense:  30 tablet    Refill:  3    Order Specific Question:   Supervising Provider    Answer:   Judyann Munson [[id number]]     Follow-up: Return in about 6 weeks (around 11/09/2020), or if symptoms worsen or fail to improve.   Marcos Eke, MSN, FNP-C Nurse Practitioner [facility] Main number: [phone]

## 2020-09-28 NOTE — Assessment & Plan Note (Signed)
   Due for routine dental care with scheduling phone number provided in after visit summary to make appointment.  Due for cervical cancer screening through Pap smear with information to schedule with Pap clinic or gynecology.  Discussed importance of safe sexual practice to reduce risk of STI.  Condoms declined.  Declines influenza and Covid vaccines today.

## 2020-10-02 LAB — COMPLETE METABOLIC PANEL WITH GFR
AG Ratio: 1 (calc) (ref 1.0–2.5)
ALT: 13 U/L (ref 6–29)
AST: 18 U/L (ref 10–30)
Albumin: 4.1 g/dL (ref 3.6–5.1)
Alkaline phosphatase (APISO): 58 U/L (ref 31–125)
BUN: 11 mg/dL (ref 7–25)
CO2: 24 mmol/L (ref 20–32)
Calcium: 9.1 mg/dL (ref 8.6–10.2)
Chloride: 103 mmol/L (ref 98–110)
Creat: 0.78 mg/dL (ref 0.50–1.10)
GFR, Est African American: 113 mL/min/{1.73_m2} (ref >=60)
GFR, Est Non African American: 98 mL/min/{1.73_m2} (ref >=60)
Globulin: 4 g/dL (calc) — ABNORMAL HIGH (ref 1.9–3.7)
Glucose, Bld: 101 mg/dL — ABNORMAL HIGH (ref 65–99)
Potassium: 4 mmol/L (ref 3.5–5.3)
Sodium: 137 mmol/L (ref 135–146)
Total Bilirubin: 0.6 mg/dL (ref 0.2–1.2)
Total Protein: 8.1 g/dL (ref 6.1–8.1)

## 2020-10-02 LAB — HIV-1 RNA QUANT-NO REFLEX-BLD
HIV 1 RNA Quant: 425 Copies/mL — ABNORMAL HIGH
HIV-1 RNA Quant, Log: 2.63 Log cps/mL — ABNORMAL HIGH

## 2020-10-21 IMAGING — XA IR FLUORO GUIDE CV LINE*R*
1 series · 1 of 1 positions shown · non-contrast
Comparison: none

CLINICAL DATA: TTP, needs venous access for pheresis

EXAM:
EXAM
RIGHT IJ CATHETER PLACEMENT UNDER ULTRASOUND AND FLUOROSCOPIC
GUIDANCE
TECHNIQUE: The procedure, risks (including but not limited to bleeding,
infection, organ damage, pneumothorax), benefits, and alternatives
were explained to the patient. Questions regarding the procedure
were encouraged and answered. The patient understands and consents
to the procedure. Patency of the right IJ vein was confirmed with
ultrasound with image documentation. An appropriate skin site was
determined. Skin site was marked. Region was prepped using maximum
barrier technique including cap and mask, sterile gown, sterile
gloves, large sterile sheet, and Chlorhexidine as cutaneous
antisepsis. The region was infiltrated locally with 1% lidocaine.
Under real-time ultrasound guidance, the right IJ vein was accessed
with a 19 gauge needle; the needle tip within the vein was confirmed
with ultrasound image documentation. The needle exchanged over a
guidewire for vascular dilator which allowed advancement of a 16 cm
Trialysis catheter. This was positioned with the tip at the
cavoatrial junction. Spot chest radiograph shows good positioning
and no pneumothorax. Catheter was flushed and sutured externally
with 0-Prolene sutures. Patient tolerated the procedure well.
FLUOROSCOPY TIME:  6 seconds, less than 0.1 mGy
COMPLICATIONS:
COMPLICATIONS
none

[Series 1: fl (-) angio · 1 of 1 slices shown]
[im 1/1]
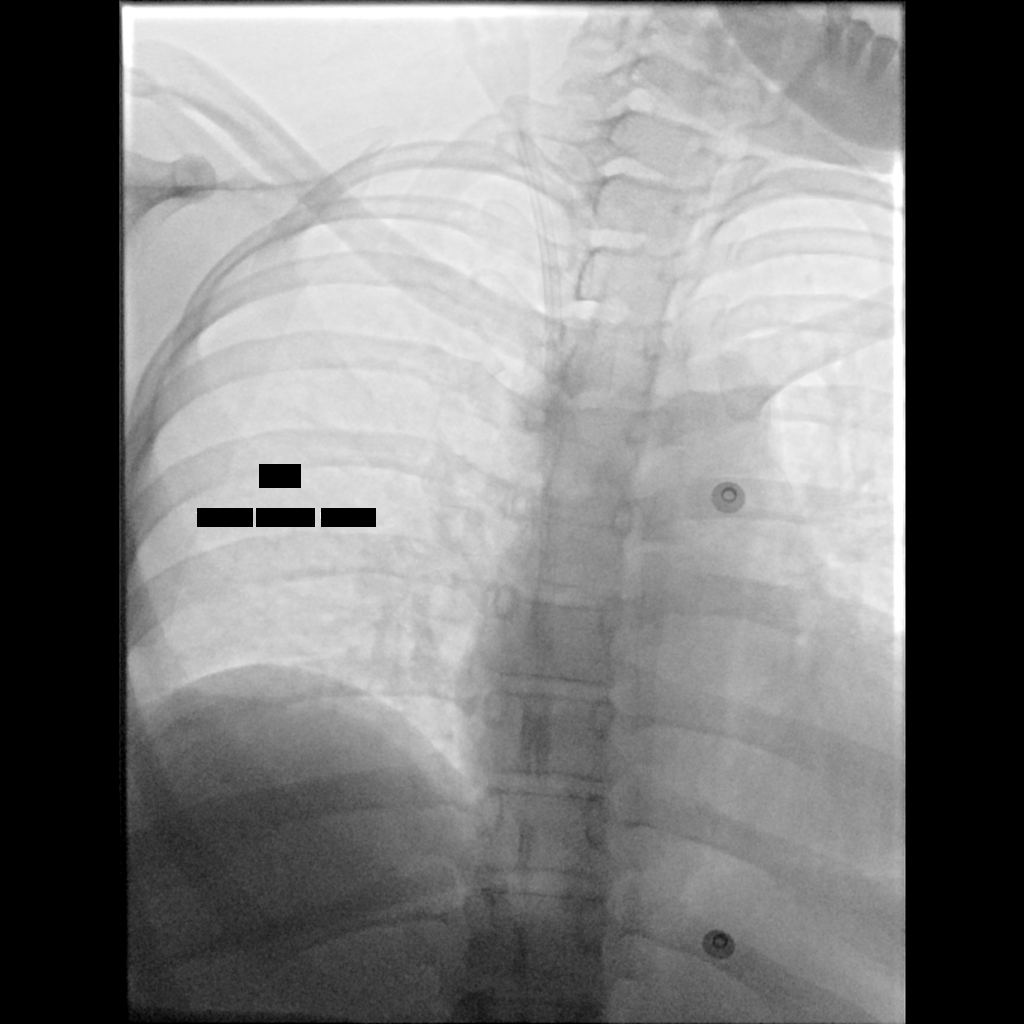

[1 of 1 positions shown; findings below may reference images not displayed]

IMPRESSION: 1. Technically successful right IJ Trialysis catheter placement.

## 2020-11-09 ENCOUNTER — Other Ambulatory Visit: Payer: Medicaid Other

## 2020-11-23 ENCOUNTER — Other Ambulatory Visit: Payer: Self-pay

## 2020-11-23 ENCOUNTER — Encounter: Payer: Self-pay | Admitting: Family

## 2020-11-23 ENCOUNTER — Ambulatory Visit (INDEPENDENT_AMBULATORY_CARE_PROVIDER_SITE_OTHER): Payer: Self-pay | Admitting: Family

## 2020-11-23 VITALS — BP 137/86 | HR 80 | Temp 98.2°F | Wt 194.0 lb

## 2020-11-23 DIAGNOSIS — B20 Human immunodeficiency virus [HIV] disease: Secondary | ICD-10-CM

## 2020-11-23 DIAGNOSIS — Z0000 Encounter for general adult medical examination without abnormal findings: Secondary | ICD-10-CM

## 2020-11-23 MED ORDER — BICTEGRAVIR-EMTRICITAB-TENOFOV 50-200-25 MG PO TABS: 1.0000 | ORAL_TABLET | Freq: Every day | ORAL | 3 refills | Status: AC

## 2020-11-23 NOTE — Assessment & Plan Note (Signed)
Catherine Martinez has continued good adherence and tolerance to her ART regimen of Biktarvy with one missed dose since her last office visit.  No signs/symptoms of opportunistic infection or progressive HIV disease.  Reviewed previous lab work and discussed plan of care.  Check blood work today.  Continue current dose of Biktarvy.  Plan for follow-up in 3 months or sooner if needed

## 2020-11-23 NOTE — Assessment & Plan Note (Signed)
   Discussed importance of safe sexual practice to reduce risk of STI.  Condoms declined. 

## 2020-11-23 NOTE — Progress Notes (Signed)
Subjective:    Patient ID: Catherine Martinez, female    DOB: [birth year], 35 y.o.   MRN: [id number]  Chief Complaint  Patient presents with  . Follow-up    B20     HPI:  Catherine Martinez is a 35 y.o. female with HIV disease who was last seen on 12/9 with improved adherence and tolerance to her ART regimen of Biktarvy.  Viral load at the time was 425 with previous CD4 count of 299.  Here today for routine follow-up.  Catherine Martinez continues to take her Biktarvy daily as prescribed with only one missed dose since her last office visit.  Overall feeling well today with no new concerns/complaints. Denies fevers, chills, night sweats, headaches, changes in vision, neck pain/stiffness, nausea, diarrhea, vomiting, lesions or rashes.  Catherine Martinez has no problems obtaining her medication from the pharmacy and renewed financial assistance.  No recreational or illicit drug use, tobacco use, or alcohol consumption.  Denies feelings of being down, depressed, or hopeless recently.   Allergies  Allergen Reactions  . Sulfamethoxazole-Trimethoprim Hives      Outpatient Medications Prior to Visit  Medication Sig Dispense Refill  . bictegravir-emtricitabine-tenofovir AF (BIKTARVY) 50-200-25 MG TABS tablet Take 1 tablet by mouth daily. 30 tablet 3  . dapsone 100 MG tablet Take 1 tablet (100 mg total) by mouth daily. (Patient not taking: Reported on 11/23/2020) 30 tablet 5  . predniSONE (DELTASONE) 20 MG tablet Take 40 mg or2 tablets every morning with breakfast for 7 days,then take 20 mg or 1 tablet every morning with breakfast for 7 days,then stop. (Patient not taking: Reported on 11/23/2020) 21 tablet 0   No facility-administered medications prior to visit.     Past Medical History:  Diagnosis Date  . Anemia    pregnant  . History of stillbirth   . HIV (human immunodeficiency virus infection) (HCC)   . Kidney infection    Recurrent hx  . Kidney infection   . SVD (spontaneous vaginal  delivery)    x 4     Past Surgical History:  Procedure Laterality Date  . CESAREAN SECTION WITH BILATERAL TUBAL LIGATION N/A 12/28/2012   Procedure: CESAREAN SECTION WITH BILATERAL TUBAL LIGATION;  Surgeon: [name], MD;  Location: [facility];  Service: Obstetrics;  Laterality: N/A;  AZT prior to section  . IR FLUORO GUIDE CV LINE RIGHT  06/12/2020  . IR US GUIDE VASC ACCESS RIGHT  06/12/2020  . TOOTH EXTRACTION N/A 08/25/2015   Procedure: MULTIPLE EXTRACTIONS TEETH # 1, 2, 3, 4, 5, 6, 7, 8, 9, 10, 11, 12, 13, 14, 15, 16, 17, 18, 31, 32 AND ALVEOLOPLASTY;  Surgeon: [name], DDS;  Location: [facility];  Service: Oral Surgery;  Laterality: N/A;    Review of Systems  Constitutional: Negative for chills, diaphoresis, fatigue and fever.  Respiratory: Negative for cough, chest tightness, shortness of breath and wheezing.   Cardiovascular: Negative for chest pain.  Gastrointestinal: Negative for abdominal pain, diarrhea, nausea and vomiting.      Objective:    BP 137/86   Pulse 80   Temp 98.2 F (36.8 C) (Oral)   Wt 194 lb (88 kg)   BMI 33.30 kg/m  Nursing note and vital signs reviewed.  Physical Exam Constitutional:      General: She is not in acute distress.    Appearance: She is well-developed.  HENT:     Mouth/Throat:     Mouth: Oropharynx is clear and moist.  Eyes:  Conjunctiva/sclera: Conjunctivae normal.  Cardiovascular:     Rate and Rhythm: Normal rate and regular rhythm.     Pulses: Intact distal pulses.     Heart sounds: Normal heart sounds. No murmur heard. No friction rub. No gallop.   Pulmonary:     Effort: Pulmonary effort is normal. No respiratory distress.     Breath sounds: Normal breath sounds. No wheezing or rales.  Chest:     Chest wall: No tenderness.  Abdominal:     General: Bowel sounds are normal.     Palpations: Abdomen is soft.     Tenderness: There is no abdominal tenderness.  Musculoskeletal:     Cervical back: Neck supple.   Lymphadenopathy:     Cervical: No cervical adenopathy.  Skin:    General: Skin is warm and dry.     Findings: No rash.  Neurological:     Mental Status: She is alert and oriented to person, place, and time.  Psychiatric:        Mood and Affect: Mood and affect normal.        Behavior: Behavior normal.        Thought Content: Thought content normal.        Judgment: Judgment normal.      Depression screen [facility] 2/9 11/23/2020 08/24/2020 07/03/2020 02/03/2013 12/15/2012  Decreased Interest 0 0 0 0 0  Down, Depressed, Hopeless 0 0 0 0 0  PHQ - 2 Score 0 0 0 0 0       Assessment & Plan:    Patient Active Problem List   Diagnosis Date Noted  . Healthcare maintenance 07/03/2020  . 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 Transaminitis 06/12/2020  . Acute lower UTI 06/12/2020  . Acute kidney injury superimposed on CKD (HCC) 06/12/2020  . Microcytic hypochromic anemia 06/12/2020  . Hyperbilirubinemia 06/12/2020  . Late prenatal care complicating pregnancy 12/14/2012  . Previous stillbirth or demise, antepartum 12/14/2012  . Trichomonas infection 11/18/2012  . CHLAMYDIAL INFECTION, HX OF 04/12/2010  . HIV INFECTION 03/29/2010     Problem List Items Addressed This Visit      Other   HIV INFECTION - Primary    Catherine Martinez has continued good adherence and tolerance to her ART regimen of Biktarvy with one missed dose since her last office visit.  No signs/symptoms of opportunistic infection or progressive HIV disease.  Reviewed previous lab work and discussed plan of care.  Check blood work today.  Continue current dose of Biktarvy.  Plan for follow-up in 3 months or sooner if needed      Relevant Medications   bictegravir-emtricitabine-tenofovir AF (BIKTARVY) 50-200-25 MG TABS tablet   Other Relevant Orders   HIV-1 RNA quant-no reflex-bld   COMPLETE METABOLIC PANEL WITH GFR   T-helper cell (CD4)- ([facility] clinic only)   Healthcare maintenance     Discussed  importance of safe sexual practice to reduce risk of STI.  Condoms declined.          I have discontinued Catherine Martinez's predniSONE and dapsone. I am also having her maintain her bictegravir-emtricitabine-tenofovir AF.   Meds ordered this encounter  Medications  . bictegravir-emtricitabine-tenofovir AF (BIKTARVY) 50-200-25 MG TABS tablet    Sig: Take 1 tablet by mouth daily.    Dispense:  30 tablet    Refill:  3    Order Specific Question:   Supervising Provider    Answer:   Judyann Munson [[id number]]     Follow-up: Return in about  3 months (around 02/20/2021), or if symptoms worsen or fail to improve.   Marcos Eke, MSN, FNP-C Nurse Practitioner [facility] for Infectious Disease [facility] Main number: [phone]

## 2020-11-23 NOTE — Patient Instructions (Addendum)
Nice to see you.  We checked your lab work today.   Continue to take your [place] daily as prescribed.   Refills have been sent to the pharmacy.  Plan for follow up in 3 months or sooner if needed.  Check into [place] as an injectable medication.   Have a great day and stay safe!

## 2020-11-24 LAB — T-HELPER CELL (CD4) - (RCID CLINIC ONLY)
CD4 % Helper T Cell: 22 % — ABNORMAL LOW (ref 33–65)
CD4 T Cell Abs: 399 /uL — ABNORMAL LOW (ref 400–1790)

## 2020-11-27 LAB — COMPLETE METABOLIC PANEL WITH GFR
AG Ratio: 1.1 (calc) (ref 1.0–2.5)
ALT: 11 U/L (ref 6–29)
AST: 15 U/L (ref 10–30)
Albumin: 4.1 g/dL (ref 3.6–5.1)
Alkaline phosphatase (APISO): 51 U/L (ref 31–125)
BUN: 10 mg/dL (ref 7–25)
CO2: 25 mmol/L (ref 20–32)
Calcium: 9.4 mg/dL (ref 8.6–10.2)
Chloride: 104 mmol/L (ref 98–110)
Creat: 0.66 mg/dL (ref 0.50–1.10)
GFR, Est African American: 132 mL/min/{1.73_m2} (ref >=60)
GFR, Est Non African American: 114 mL/min/{1.73_m2} (ref >=60)
Globulin: 3.7 g/dL (calc) (ref 1.9–3.7)
Glucose, Bld: 86 mg/dL (ref 65–99)
Potassium: 3.9 mmol/L (ref 3.5–5.3)
Sodium: 138 mmol/L (ref 135–146)
Total Bilirubin: 0.8 mg/dL (ref 0.2–1.2)
Total Protein: 7.8 g/dL (ref 6.1–8.1)

## 2020-11-27 LAB — HIV-1 RNA QUANT-NO REFLEX-BLD
HIV 1 RNA Quant: <20 Copies/mL
HIV-1 RNA Quant, Log: <1.3 Log cps/mL

## 2021-02-16 ENCOUNTER — Inpatient Hospital Stay: Payer: Medicaid Other | Admitting: Oncology

## 2021-02-16 ENCOUNTER — Inpatient Hospital Stay: Payer: Medicaid Other | Attending: Oncology

## 2021-03-08 ENCOUNTER — Other Ambulatory Visit: Payer: Self-pay

## 2021-03-08 ENCOUNTER — Encounter: Payer: Self-pay | Admitting: Family

## 2021-03-08 ENCOUNTER — Ambulatory Visit (INDEPENDENT_AMBULATORY_CARE_PROVIDER_SITE_OTHER): Payer: Self-pay | Admitting: Family

## 2021-03-08 VITALS — BP 129/88 | HR 92 | Temp 98.3°F | Wt 144.4 lb

## 2021-03-08 DIAGNOSIS — B20 Human immunodeficiency virus [HIV] disease: Secondary | ICD-10-CM

## 2021-03-08 DIAGNOSIS — Z0000 Encounter for general adult medical examination without abnormal findings: Secondary | ICD-10-CM

## 2021-03-08 NOTE — Patient Instructions (Signed)
Nice to see you.  We will check your lab work today.  Restart taking your medications.  Plan for follow up in 1 month or sooner if needed.   Have a great day and stay safe!

## 2021-03-08 NOTE — Assessment & Plan Note (Signed)
Catherine Martinez has less than optimal adherence to her ART regimen of Biktarvy having missed the last 2 weeks.Discussed importance of taking medication daily as prescribed and informing clinic if she is having problems obtaining medication.  No signs/symptoms of opportunistic infection or progressive HIV.  Check blood work today.  Renew financial assistance.  Samples of Biktarvy provided and recorded in pharmacy log.  Plan for follow-up in 1 month or sooner if needed to recheck blood work.

## 2021-03-08 NOTE — Assessment & Plan Note (Signed)
   Discussed importance of safe sexual practice to reduce risk of STI. Condoms provided.  

## 2021-03-08 NOTE — Progress Notes (Signed)
Brief Narrative   Patient ID: Catherine Martinez, female    DOB: May 24, 1986, 35 y.o.   MRN: [id number]    Subjective:    Chief Complaint  Patient presents with  . Follow-up    3 month follow up , no new concerns , missed dose  , last dose was 2 weeks ago      HPI:  Catherine Martinez is a 35 y.o. female with HIV disease last seen on 11/23/20 with well controlled virus and good adherence and tolerance to her ART regimen of Biktarvy. Viral load was undetectable and CD4 count 399. Here today for routine follow up.   Catherine Martinez has been out of medication for the past 2 weeks secondary to working out of town and her financial assistance has expired.  Overall feeling well today with no new concerns/complaints. Denies fevers, chills, night sweats, headaches, changes in vision, neck pain/stiffness, nausea, diarrhea, vomiting, lesions or rashes.  Catherine Martinez is in need of renewing her financial assistance and will do so today.  Denies feelings of being down, depressed, or hopeless recently.  No recreational or illicit drug use, tobacco use, or alcohol consumption.  Condoms provided. 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    Allergies  Allergen Reactions  . Sulfamethoxazole-Trimethoprim Hives      Outpatient Medications Prior to Visit  Medication Sig Dispense Refill  . bictegravir-emtricitabine-tenofovir AF (BIKTARVY) 50-200-25 MG TABS tablet Take 1 tablet by mouth daily. 30 tablet 3   No facility-administered medications prior to visit.     Past Medical History:  Diagnosis Date  . Anemia    pregnant  . History of stillbirth   . HIV (human immunodeficiency virus infection) (HCC)   . Kidney infection    Recurrent hx  . Kidney infection   . SVD (spontaneous vaginal delivery)    x 4     Past Surgical History:  Procedure Laterality Date  . CESAREAN SECTION WITH BILATERAL TUBAL LIGATION N/A 12/28/2012   Procedure: CESAREAN SECTION WITH  BILATERAL TUBAL LIGATION;  Surgeon: [name], MD;  Location: [facility];  Service: Obstetrics;  Laterality: N/A;  AZT prior to section  . IR FLUORO GUIDE CV LINE RIGHT  06/12/2020  . IR US GUIDE VASC ACCESS RIGHT  06/12/2020  . TOOTH EXTRACTION N/A 08/25/2015   Procedure: MULTIPLE EXTRACTIONS TEETH # 1, 2, 3, 4, 5, 6, 7, 8, 9, 10, 11, 12, 13, 14, 15, 16, 17, 18, 31, 32 AND ALVEOLOPLASTY;  Surgeon: [name], DDS;  Location: [facility];  Service: Oral Surgery;  Laterality: N/A;       Review of Systems  Constitutional: Negative for appetite change, chills, diaphoresis, fatigue, fever and unexpected weight change.  Eyes:       Negative for acute change in vision  Respiratory: Negative for chest tightness, shortness of breath and wheezing.   Cardiovascular: Negative for chest pain.  Gastrointestinal: Negative for diarrhea, nausea and vomiting.  Genitourinary: Negative for dysuria, pelvic pain and vaginal discharge.  Musculoskeletal: Negative for neck pain and neck stiffness.  Skin: Negative for rash.  Neurological: Negative for seizures, syncope, weakness and headaches.  Hematological: Negative for adenopathy. Does not bruise/bleed easily.  Psychiatric/Behavioral: Negative for hallucinations.      Objective:    BP 129/88   Pulse 92   Temp 98.3 F (36.8 C) (Oral)   Wt 144 lb 6.4 oz (65.5 kg)   SpO2 100%   BMI 24.79 kg/m  Nursing note and vital signs reviewed.  Physical Exam Constitutional:      General: She is not in acute distress.    Appearance: She is well-developed.  Eyes:     Conjunctiva/sclera: Conjunctivae normal.  Cardiovascular:     Rate and Rhythm: Normal rate and regular rhythm.     Heart sounds: Normal heart sounds. No murmur heard. No friction rub. No gallop.   Pulmonary:     Effort: Pulmonary effort is normal. No respiratory distress.     Breath sounds: Normal breath sounds. No wheezing or rales.  Chest:     Chest wall: No tenderness.  Abdominal:     General:  Bowel sounds are normal.     Palpations: Abdomen is soft.     Tenderness: There is no abdominal tenderness.  Musculoskeletal:     Cervical back: Neck supple.  Lymphadenopathy:     Cervical: No cervical adenopathy.  Skin:    General: Skin is warm and dry.     Findings: No rash.  Neurological:     Mental Status: She is alert and oriented to person, place, and time.  Psychiatric:        Behavior: Behavior normal.        Thought Content: Thought content normal.        Judgment: Judgment normal.      Depression screen [facility] 2/9 03/08/2021 11/23/2020 08/24/2020 07/03/2020 02/03/2013  Decreased Interest 0 0 0 0 0  Down, Depressed, Hopeless 0 0 0 0 0  PHQ - 2 Score 0 0 0 0 0       Assessment & Plan:    Patient Active Problem List   Diagnosis Date Noted  . Healthcare maintenance 07/03/2020  . 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 Transaminitis 06/12/2020  . Acute lower UTI 06/12/2020  . Acute kidney injury superimposed on CKD (HCC) 06/12/2020  . Microcytic hypochromic anemia 06/12/2020  . Hyperbilirubinemia 06/12/2020  . Late prenatal care complicating pregnancy 12/14/2012  . Previous stillbirth or demise, antepartum 12/14/2012  . Trichomonas infection 11/18/2012  . CHLAMYDIAL INFECTION, HX OF 04/12/2010  . HIV INFECTION 03/29/2010     Problem List Items Addressed This Visit      Other   HIV INFECTION - Primary    Ms. Morrissette has less than optimal adherence to her ART regimen of Biktarvy having missed the last 2 weeks.Discussed importance of taking medication daily as prescribed and informing clinic if she is having problems obtaining medication.  No signs/symptoms of opportunistic infection or progressive HIV.  Check blood work today.  Renew financial assistance.  Samples of Biktarvy provided and recorded in pharmacy log.  Plan for follow-up in 1 month or sooner if needed to recheck blood work.      Relevant Orders   T-helper cell (CD4)- ([facility] clinic only)    HIV RNA, RTPCR W/R GT (RTI, PI,INT)   Healthcare maintenance     Discussed importance of safe sexual practice to reduce risk of STI.  Condoms provided.          I am having [name] maintain her bictegravir-emtricitabine-tenofovir AF.   Follow-up: Return in about 1 month (around 04/08/2021).   Marcos Eke, MSN, FNP-C Nurse Practitioner [facility] for Infectious Disease [facility] Main number: [phone]

## 2021-03-09 LAB — T-HELPER CELL (CD4) - (RCID CLINIC ONLY)
CD4 % Helper T Cell: 18 % — ABNORMAL LOW (ref 33–65)
CD4 T Cell Abs: 248 /uL — ABNORMAL LOW (ref 400–1790)

## 2021-03-12 ENCOUNTER — Encounter: Payer: Self-pay | Admitting: Family

## 2021-03-20 LAB — HIV-1 INTEGRASE GENOTYPE

## 2021-03-20 LAB — HIV RNA, RTPCR W/R GT (RTI, PI,INT)
HIV 1 RNA Quant: 36700 copies/mL — ABNORMAL HIGH
HIV-1 RNA Quant, Log: 4.56 Log copies/mL — ABNORMAL HIGH

## 2021-03-20 LAB — HIV-1 GENOTYPE: HIV-1 Genotype: DETECTED — AB

## 2021-04-09 ENCOUNTER — Ambulatory Visit: Payer: Medicaid Other | Admitting: Family

## 2021-05-10 ENCOUNTER — Telehealth: Payer: Self-pay

## 2021-05-10 NOTE — Telephone Encounter (Signed)
Called patient to reschedule missed appointment, no answer and mailbox is full.   Sandie Ano, RN

## 2021-07-13 ENCOUNTER — Telehealth: Payer: Self-pay

## 2021-07-13 NOTE — Telephone Encounter (Signed)
Attempted to call patient to schedule overdue appointment, no answer. Left HIPAA compliant voicemail requesting callback.   Maynor Mwangi D Kastiel Simonian, RN   

## 2021-11-14 ENCOUNTER — Telehealth: Payer: Self-pay

## 2021-11-14 NOTE — Telephone Encounter (Signed)
Called patient to offer appointment, no answer and mailbox full.   Setsuko Robins D Delmer Kowalski, RN
